# Patient Record
Sex: Female | Born: 1985
Health system: Southern US, Community
[De-identification: ages and names within clinical notes are randomized; demographics above are authoritative.]

## PROBLEM LIST (undated history)

## (undated) DIAGNOSIS — D649 Anemia, unspecified: Secondary | ICD-10-CM

---
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
## 2005-11-05 ENCOUNTER — Ambulatory Visit (HOSPITAL_COMMUNITY): Admission: RE | Admit: 2005-11-05 | Discharge: 2005-11-05 | Payer: Self-pay | Admitting: Obstetrics

## 2005-11-18 ENCOUNTER — Ambulatory Visit (HOSPITAL_COMMUNITY): Admission: RE | Admit: 2005-11-18 | Discharge: 2005-11-18 | Payer: Self-pay | Admitting: Obstetrics

## 2006-04-26 ENCOUNTER — Inpatient Hospital Stay (HOSPITAL_COMMUNITY): Admission: AD | Admit: 2006-04-26 | Discharge: 2006-04-26 | Payer: Self-pay | Admitting: Obstetrics

## 2006-05-08 ENCOUNTER — Inpatient Hospital Stay (HOSPITAL_COMMUNITY): Admission: RE | Admit: 2006-05-08 | Discharge: 2006-05-12 | Payer: Self-pay | Admitting: Obstetrics

## 2006-06-03 ENCOUNTER — Ambulatory Visit: Payer: Self-pay | Admitting: Oncology

## 2006-06-10 ENCOUNTER — Inpatient Hospital Stay (HOSPITAL_COMMUNITY): Admission: AD | Admit: 2006-06-10 | Discharge: 2006-06-12 | Payer: Self-pay | Admitting: Obstetrics

## 2006-06-16 LAB — CBC WITH DIFFERENTIAL/PLATELET
BASO%: 0.3 % (ref 0.0–2.0)
EOS%: 1.4 % (ref 0.0–7.0)
HCT: 35.7 % (ref 34.8–46.6)
RBC: 3.79 10*6/uL (ref 3.70–5.32)
WBC: 11.6 10*3/uL — ABNORMAL HIGH (ref 3.9–10.0)

## 2006-06-17 ENCOUNTER — Inpatient Hospital Stay (HOSPITAL_COMMUNITY): Admission: AD | Admit: 2006-06-17 | Discharge: 2006-06-21 | Payer: Self-pay | Admitting: Obstetrics

## 2006-06-17 LAB — FOLATE RBC: RBC Folate: 729 ng/mL — ABNORMAL HIGH (ref 180–600)

## 2006-06-17 LAB — IRON AND TIBC
%SAT: 7 % — ABNORMAL LOW (ref 20–55)
TIBC: 421 ug/dL (ref 250–470)
UIBC: 393 ug/dL

## 2006-06-17 LAB — FERRITIN: Ferritin: 64 ng/mL (ref 10–291)

## 2006-06-17 LAB — VITAMIN B12: Vitamin B-12: 619 pg/mL (ref 211–911)

## 2009-08-25 HISTORY — PX: TUBAL LIGATION: SHX77

## 2009-09-28 ENCOUNTER — Ambulatory Visit (HOSPITAL_COMMUNITY): Admission: RE | Admit: 2009-09-28 | Discharge: 2009-09-28 | Payer: Self-pay | Admitting: Obstetrics

## 2009-11-29 ENCOUNTER — Ambulatory Visit (HOSPITAL_COMMUNITY): Admission: RE | Admit: 2009-11-29 | Discharge: 2009-11-29 | Payer: Self-pay | Admitting: Obstetrics

## 2010-01-20 ENCOUNTER — Inpatient Hospital Stay (HOSPITAL_COMMUNITY): Admission: AD | Admit: 2010-01-20 | Discharge: 2010-01-20 | Payer: Self-pay | Admitting: Obstetrics

## 2010-04-21 ENCOUNTER — Inpatient Hospital Stay (HOSPITAL_COMMUNITY): Admission: AD | Admit: 2010-04-21 | Discharge: 2010-04-21 | Payer: Self-pay | Admitting: Obstetrics

## 2010-04-27 ENCOUNTER — Inpatient Hospital Stay (HOSPITAL_COMMUNITY): Admission: AD | Admit: 2010-04-27 | Discharge: 2010-04-29 | Payer: Self-pay | Admitting: Obstetrics

## 2010-07-03 ENCOUNTER — Ambulatory Visit (HOSPITAL_COMMUNITY): Admission: RE | Admit: 2010-07-03 | Discharge: 2010-07-03 | Payer: Self-pay | Admitting: Obstetrics

## 2010-11-05 LAB — CBC
HCT: 38.1 % (ref 36.0–46.0)
Hemoglobin: 12.6 g/dL (ref 12.0–15.0)
MCH: 30.5 pg (ref 26.0–34.0)
MCV: 92.5 fL (ref 78.0–100.0)
Platelets: 167 10*3/uL (ref 150–400)
RBC: 4.11 MIL/uL (ref 3.87–5.11)
RDW: 13.5 % (ref 11.5–15.5)
WBC: 7 10*3/uL (ref 4.0–10.5)

## 2010-11-05 LAB — SURGICAL PCR SCREEN
MRSA, PCR: NEGATIVE
Staphylococcus aureus: POSITIVE — AB

## 2010-11-07 LAB — CBC
HCT: 30.4 % — ABNORMAL LOW (ref 36.0–46.0)
MCHC: 33 g/dL (ref 30.0–36.0)
MCV: 91.4 fL (ref 78.0–100.0)
MCV: 92 fL (ref 78.0–100.0)
Platelets: 92 10*3/uL — ABNORMAL LOW (ref 150–400)
RBC: 3.31 MIL/uL — ABNORMAL LOW (ref 3.87–5.11)
RBC: 3.59 MIL/uL — ABNORMAL LOW (ref 3.87–5.11)
RDW: 15 % (ref 11.5–15.5)
WBC: 14.5 10*3/uL — ABNORMAL HIGH (ref 4.0–10.5)

## 2010-11-07 LAB — COMPREHENSIVE METABOLIC PANEL
AST: 36 U/L (ref 0–37)
Albumin: 2.9 g/dL — ABNORMAL LOW (ref 3.5–5.2)
Alkaline Phosphatase: 174 U/L — ABNORMAL HIGH (ref 39–117)
BUN: 6 mg/dL (ref 6–23)
Calcium: 8.9 mg/dL (ref 8.4–10.5)
GFR calc non Af Amer: >60 mL/min (ref >60)
Glucose, Bld: 75 mg/dL (ref 70–99)
Total Bilirubin: 0.5 mg/dL (ref 0.3–1.2)
Total Protein: 5.6 g/dL — ABNORMAL LOW (ref 6.0–8.3)

## 2010-11-07 LAB — LACTATE DEHYDROGENASE: LDH: 318 U/L — ABNORMAL HIGH (ref 94–250)

## 2010-11-07 LAB — RPR: RPR Ser Ql: NONREACTIVE

## 2010-11-11 LAB — URINALYSIS, ROUTINE W REFLEX MICROSCOPIC
Bilirubin Urine: NEGATIVE
Glucose, UA: NEGATIVE mg/dL
Hgb urine dipstick: NEGATIVE
Ketones, ur: NEGATIVE mg/dL
Protein, ur: NEGATIVE mg/dL
Specific Gravity, Urine: 1.02 (ref 1.005–1.030)
pH: 6.5 (ref 5.0–8.0)

## 2011-01-10 NOTE — Discharge Summary (Signed)
NAMEMIKALAH, SKYLES NO.:  [id number]   MEDICAL RECORD NO.:  [id number]          PATIENT TYPE:  INP   LOCATION:  9157                          FACILITY:  [facility]   PHYSICIAN:  Kathreen Cosier, M.D.DATE OF BIRTH:  1986/03/26   DATE OF ADMISSION:  05/08/2006  DATE OF DISCHARGE:  05/12/2006                                 DISCHARGE SUMMARY   Patient is a 25 year old gravida 2, para 1-0-0-1, [facility] June 20, 2006, was  admitted for premature contractions.  She had been on Terbutaline 2.5 p.o.  q.4 h. at home, and she was admitted for magnesium sulfate 4 g loaded, 2 g  oral.  She subsequently stopped contracting and was changed from magnesium  to Procardia 60 XL and was discharged on May 12, 2006, to see me in 2  weeks.   DISCHARGE DIAGNOSIS:  Status post admission for premature contraction.           ______________________________  Kathreen Cosier, M.D.     BAM/MEDQ  D:  06/03/2006  T:  06/04/2006  Job:  [id number]

## 2011-01-10 NOTE — Discharge Summary (Signed)
NAMEKARMAH, POTOCKI NO.:  [id number]   MEDICAL RECORD NO.:  [id number]          PATIENT TYPE:  INP   LOCATION:  9130                          FACILITY:  [facility]   PHYSICIAN:  Kathreen Cosier, M.D.DATE OF BIRTH:  June 12, 1986   DATE OF ADMISSION:  06/10/2006  DATE OF DISCHARGE:  06/12/2006                                 DISCHARGE SUMMARY   The patient is a 25 year old gravida 2, para 1-0-0-1, [facility] June 20, 2006,  in labor, 2-3 cm, 60%, vertex at -2 go 3.  Patient had a normal vaginal  delivery of a female, Apgars 8 and 9, nuchal cord cut prior to delivery of  the shoulders.  Post delivery hemoglobin was 12.4.  On admission, hemoglobin  12.8, white count 9.6, platelets 8.6.  Post delivery hemoglobin 11.8,  platelets 85.  Bleeding time 9.5.  Sodium 136, potassium 4.2.  Urine  negative.   The patient was discharged on the second postpartum day ambulatory and on a  regular diet.  To see me in six weeks.  She also had an appointment with the  hematologist, which she could not get prior to delivery, in one week.           ______________________________  Kathreen Cosier, M.D.     BAM/MEDQ  D:  07/01/2006  T:  07/01/2006  Job:  [id number]

## 2011-01-10 NOTE — Discharge Summary (Signed)
Meghan Bennett, GAUGHRAN NO.:  [id number]   MEDICAL RECORD NO.:  [id number]          PATIENT TYPE:  INP   LOCATION:  9307                          [facility]ACILITY:  [facility]   PHYSICIAN:  Kathreen Cosier, M.D.DATE OF BIRTH:  30-Sep-1985   DATE OF ADMISSION:  06/17/2006  DATE OF DISCHARGE:  06/21/2006                                 DISCHARGE SUMMARY   The patient is a 25 year old gravida 2, para 2-0-0-2, who had normal vaginal  delivery on October 17. She was readmitted on June 17, 2006 with lower  abdominal pain, more on the right.  She had a CT scan to rule out  appendicitis.  This was done.  Her uterus was tender.  She was admitted for  IV antibiotics for possible endometriosis.  She received ampicillin and  gentamicin and tolerated this well.  On admission, her white count was 17.5,  hemoglobin 12.6, platelets 307.  Creatinine 0.4.  On October 26, her white  count was normal was down to 7.7, hemoglobin 11.8.  Urinalysis was negative.  The patient did well and was discharged on June 21, 2006 on ampicillin  500 mg p.o. q.6h., to see me in four weeks.   DISCHARGE DIAGNOSIS:  Status post rehospitalization for endometriosis.           ______________________________  Kathreen Cosier, M.D.     BAM/MEDQ  D:  07/08/2006  T:  07/08/2006  Job:  [id number]

## 2012-05-11 ENCOUNTER — Emergency Department (HOSPITAL_BASED_OUTPATIENT_CLINIC_OR_DEPARTMENT_OTHER)
Admission: EM | Admit: 2012-05-11 | Discharge: 2012-05-11 | Disposition: A | Discharge status: Home / Self Care | Payer: BC Managed Care – PPO | Attending: Emergency Medicine | Admitting: Emergency Medicine

## 2012-05-11 ENCOUNTER — Encounter (HOSPITAL_BASED_OUTPATIENT_CLINIC_OR_DEPARTMENT_OTHER): Payer: Self-pay | Admitting: Emergency Medicine

## 2012-05-11 DIAGNOSIS — A499 Bacterial infection, unspecified: Secondary | ICD-10-CM | POA: Insufficient documentation

## 2012-05-11 DIAGNOSIS — N390 Urinary tract infection, site not specified: Secondary | ICD-10-CM

## 2012-05-11 DIAGNOSIS — B3749 Other urogenital candidiasis: Secondary | ICD-10-CM | POA: Insufficient documentation

## 2012-05-11 DIAGNOSIS — B373 Candidiasis of vulva and vagina: Secondary | ICD-10-CM

## 2012-05-11 DIAGNOSIS — N760 Acute vaginitis: Secondary | ICD-10-CM | POA: Insufficient documentation

## 2012-05-11 DIAGNOSIS — B9689 Other specified bacterial agents as the cause of diseases classified elsewhere: Secondary | ICD-10-CM | POA: Insufficient documentation

## 2012-05-11 LAB — URINALYSIS, ROUTINE W REFLEX MICROSCOPIC
Glucose, UA: NEGATIVE mg/dL
Hgb urine dipstick: NEGATIVE
Ketones, ur: NEGATIVE mg/dL
Nitrite: POSITIVE — AB
Protein, ur: NEGATIVE mg/dL
Specific Gravity, Urine: 1.025 (ref 1.005–1.030)
pH: 6 (ref 5.0–8.0)

## 2012-05-11 LAB — WET PREP, GENITAL: Trich, Wet Prep: NONE SEEN

## 2012-05-11 LAB — URINE MICROSCOPIC-ADD ON

## 2012-05-11 MED ORDER — SULFAMETHOXAZOLE-TRIMETHOPRIM 800-160 MG PO TABS: 1.0000 | ORAL_TABLET | Freq: Two times a day (BID) | ORAL | Status: DC

## 2012-05-11 MED ORDER — FLUCONAZOLE 150 MG PO TABS: ORAL_TABLET | ORAL | Status: DC

## 2012-05-11 MED ORDER — METRONIDAZOLE 500 MG PO TABS: 500.0000 mg | ORAL_TABLET | Freq: Two times a day (BID) | ORAL | Status: DC

## 2012-05-11 NOTE — ED Notes (Signed)
Pt reports 3/10 right sided flank pain that began on Saturday. Pt also reports fever last night. Pt took tylenol with results. NAD.

## 2012-05-11 NOTE — ED Notes (Signed)
MD at bedside. 

## 2012-05-11 NOTE — ED Provider Notes (Signed)
History     CSN: [id number]  Arrival date & time 05/11/12  1203   First MD Initiated Contact with Patient 05/11/12 1230      Chief Complaint  Patient presents with  . Flank Pain    (Consider location/radiation/quality/duration/timing/severity/associated sxs/prior treatment) HPI Comments: Patient with right flank pain for the past few days.  No injury or trauma.  Denies urinary complaints.  No fevers or chills.  She is also concerned she may have an std.  She reports a foul odor but no bleeding or discharge.  She is sexually active with two partners.  Her LMP was last week, however she has had a tubal ligation 2 years ago.  Patient is a 26 y.o. female presenting with flank pain. The history is provided by the patient.  Flank Pain This is a new problem. The current episode started 2 days ago. The problem occurs constantly. The problem has been gradually worsening. Associated symptoms include abdominal pain. Nothing aggravates the symptoms. Nothing relieves the symptoms. She has tried nothing for the symptoms. The treatment provided no relief.    History reviewed. No pertinent past medical history.  Past Surgical History  Procedure Date  . Tubal ligation 2011    No family history on file.  History  Substance Use Topics  . Smoking status: Former Smoker    Types: Cigarettes  . Smokeless tobacco: Not on file  . Alcohol Use: No    OB History    Grav Para Term Preterm Abortions TAB SAB Ect Mult Living                  Review of Systems  Gastrointestinal: Positive for abdominal pain.  Genitourinary: Positive for flank pain.  All other systems reviewed and are negative.    Allergies  Review of patient's allergies indicates no known allergies.  Home Medications  No current outpatient prescriptions on file.  BP 126/64  Pulse 87  Temp 98.2 F (36.8 C) (Oral)  Resp 16  Ht 5\' 2"  (1.575 m)  Wt 125 lb (56.7 kg)  BMI 22.86 kg/m2  SpO2 97%  LMP 05/03/2012  Physical  Exam  Nursing note and vitals reviewed. Constitutional: She is oriented to person, place, and time. She appears well-developed and well-nourished. No distress.  HENT:  Head: Normocephalic and atraumatic.  Neck: Normal range of motion. Neck supple.  Cardiovascular: Normal rate and regular rhythm.  Exam reveals no gallop and no friction rub.   No murmur heard. Pulmonary/Chest: Effort normal and breath sounds normal. No respiratory distress. She has no wheezes.  Abdominal: Soft. Bowel sounds are normal. She exhibits no distension. There is no tenderness.       There is ttp in the right flank and right mid abdomen.  There is no rebound or guarding.  Bowel sounds are normoactive.  Genitourinary: Uterus normal. Vaginal discharge found.       There is yellowish discharge present.  There is no bleeding.  There is no adnexal mass or cmt.  Musculoskeletal: Normal range of motion.  Neurological: She is alert and oriented to person, place, and time.  Skin: Skin is warm and dry. She is not diaphoretic.    ED Course  Procedures (including critical care time)   Labs Reviewed  URINALYSIS, ROUTINE W REFLEX MICROSCOPIC  WET PREP, GENITAL  GC/CHLAMYDIA PROBE AMP, GENITAL  PREGNANCY, URINE   No results found.   No diagnosis found.    MDM  The patient presents here with pain in the  right flank and suprapubic region.  She is also concerned she may have an std.  The ua shows a ua and the wet prep looks like both a yeast infection and bacterial vaginosis.  The GC and chlamydia tests are pending at this point.  Will treat with bactrim for the urine, diflucan for the yeast, and flagyl for the BV.  Will call if her other tests return and require further treatment.          Geoffery Lyons, MD 05/11/12 [phone]

## 2012-05-12 LAB — GC/CHLAMYDIA PROBE AMP, GENITAL: Chlamydia, DNA Probe: NEGATIVE

## 2012-05-12 NOTE — ED Notes (Signed)
Pt called requesting return to work note. Note printed and will be at registration desk.

## 2012-05-28 ENCOUNTER — Encounter (HOSPITAL_BASED_OUTPATIENT_CLINIC_OR_DEPARTMENT_OTHER): Payer: Self-pay

## 2012-05-28 ENCOUNTER — Emergency Department (HOSPITAL_BASED_OUTPATIENT_CLINIC_OR_DEPARTMENT_OTHER)
Admission: EM | Admit: 2012-05-28 | Discharge: 2012-05-28 | Disposition: A | Discharge status: Home / Self Care | Payer: Self-pay | Attending: Emergency Medicine | Admitting: Emergency Medicine

## 2012-05-28 DIAGNOSIS — B373 Candidiasis of vulva and vagina: Secondary | ICD-10-CM

## 2012-05-28 DIAGNOSIS — N898 Other specified noninflammatory disorders of vagina: Secondary | ICD-10-CM | POA: Insufficient documentation

## 2012-05-28 DIAGNOSIS — F17200 Nicotine dependence, unspecified, uncomplicated: Secondary | ICD-10-CM | POA: Insufficient documentation

## 2012-05-28 LAB — URINE MICROSCOPIC-ADD ON

## 2012-05-28 LAB — URINALYSIS, ROUTINE W REFLEX MICROSCOPIC
Bilirubin Urine: NEGATIVE
Glucose, UA: NEGATIVE mg/dL
Hgb urine dipstick: NEGATIVE
Protein, ur: NEGATIVE mg/dL
Specific Gravity, Urine: 1.018 (ref 1.005–1.030)
Urobilinogen, UA: 0.2 mg/dL (ref 0.0–1.0)

## 2012-05-28 LAB — WET PREP, GENITAL

## 2012-05-28 MED ORDER — FLUCONAZOLE 150 MG PO TABS: 150.0000 mg | ORAL_TABLET | Freq: Once | ORAL | Status: DC

## 2012-05-28 NOTE — ED Provider Notes (Signed)
History     CSN: [id number]  Arrival date & time 05/28/12  1132   First MD Initiated Contact with Patient 05/28/12 1209      Chief Complaint  Patient presents with  . Vaginal Itching  . Vaginal Discharge    (Consider location/radiation/quality/duration/timing/severity/associated sxs/prior treatment) Patient is a 26 y.o. female presenting with vaginal itching and vaginal discharge. The history is provided by the patient. No language interpreter was used.  Vaginal Itching This is a new problem. The current episode started today. The problem occurs constantly. The problem has been gradually worsening. Associated symptoms comments: Vaginal discharge. Nothing aggravates the symptoms. She has tried nothing for the symptoms. The treatment provided no relief.  Vaginal Discharge    History reviewed. No pertinent past medical history.  Past Surgical History  Procedure Date  . Tubal ligation 2011    No family history on file.  History  Substance Use Topics  . Smoking status: Light Tobacco Smoker    Types: Cigarettes  . Smokeless tobacco: Not on file  . Alcohol Use: Yes     occasional    OB History    Grav Para Term Preterm Abortions TAB SAB Ect Mult Living                  Review of Systems  Genitourinary: Positive for vaginal discharge.  All other systems reviewed and are negative.    Allergies  Review of patient's allergies indicates no known allergies.  Home Medications   Current Outpatient Rx  Name Route Sig Dispense Refill  . FLUCONAZOLE 150 MG PO TABS  Take once, then repeat in two weeks. 2 tablet 0  . METRONIDAZOLE 500 MG PO TABS Oral Take 1 tablet (500 mg total) by mouth 2 (two) times daily. One po bid x 7 days 14 tablet 0  . SULFAMETHOXAZOLE-TRIMETHOPRIM 800-160 MG PO TABS Oral Take 1 tablet by mouth 2 (two) times daily. 10 tablet 0    BP 113/65  Pulse 77  Temp 98.7 F (37.1 C) (Oral)  Resp 16  Ht 5\' 2"  (1.575 m)  Wt 120 lb (54.432 kg)  BMI 21.95  kg/m2  SpO2 100%  LMP 04/16/2012  Physical Exam  Nursing note and vitals reviewed. Constitutional: She appears well-developed and well-nourished.  HENT:  Head: Normocephalic and atraumatic.  Eyes: Pupils are equal, round, and reactive to light.  Neck: Normal range of motion.  Cardiovascular: Normal rate.   Pulmonary/Chest: Effort normal and breath sounds normal.  Abdominal: Soft. Bowel sounds are normal.  Genitourinary: Vaginal discharge found.       Thick white vaginal discharge  Musculoskeletal: Normal range of motion.  Neurological: She is alert.  Skin: Skin is warm.    ED Course  Procedures (including critical care time)  Labs Reviewed  URINALYSIS, ROUTINE W REFLEX MICROSCOPIC - Abnormal; Notable for the following:    Leukocytes, UA MODERATE (*)     All other components within normal limits  URINE MICROSCOPIC-ADD ON - Abnormal; Notable for the following:    Squamous Epithelial / LPF MANY (*)     Bacteria, UA MANY (*)     All other components within normal limits  WET PREP, GENITAL - Abnormal; Notable for the following:    Yeast Wet Prep HPF POC TOO NUMEROUS TO COUNT (*)     WBC, Wet Prep HPF POC RARE (*)     All other components within normal limits  PREGNANCY, URINE  GC/CHLAMYDIA PROBE AMP, GENITAL   No results  found.   No diagnosis found.    MDM  Pt given rx for diflucan.  Urine contaminated,  No symptoms.  I will not treat urine.          Lonia Skinner [place], [place] 05/28/12 1251

## 2012-05-28 NOTE — ED Notes (Signed)
Pt reports vaginal itching and discharge x 1 week.

## 2012-05-31 NOTE — ED Provider Notes (Signed)
Medical screening examination/treatment/procedure(s) were performed by non-physician practitioner and as supervising physician I was immediately available for consultation/collaboration.   Gwyneth Sprout, MD 05/31/12 [phone]

## 2013-03-25 ENCOUNTER — Encounter (HOSPITAL_BASED_OUTPATIENT_CLINIC_OR_DEPARTMENT_OTHER): Payer: Self-pay | Admitting: *Deleted

## 2013-03-25 ENCOUNTER — Emergency Department (HOSPITAL_BASED_OUTPATIENT_CLINIC_OR_DEPARTMENT_OTHER)
Admission: EM | Admit: 2013-03-25 | Discharge: 2013-03-25 | Disposition: A | Discharge status: Home / Self Care | Payer: Self-pay | Attending: Emergency Medicine | Admitting: Emergency Medicine

## 2013-03-25 DIAGNOSIS — L293 Anogenital pruritus, unspecified: Secondary | ICD-10-CM | POA: Insufficient documentation

## 2013-03-25 DIAGNOSIS — A599 Trichomoniasis, unspecified: Secondary | ICD-10-CM

## 2013-03-25 DIAGNOSIS — Z9851 Tubal ligation status: Secondary | ICD-10-CM | POA: Insufficient documentation

## 2013-03-25 DIAGNOSIS — Z3202 Encounter for pregnancy test, result negative: Secondary | ICD-10-CM | POA: Insufficient documentation

## 2013-03-25 DIAGNOSIS — F17200 Nicotine dependence, unspecified, uncomplicated: Secondary | ICD-10-CM | POA: Insufficient documentation

## 2013-03-25 DIAGNOSIS — A5901 Trichomonal vulvovaginitis: Secondary | ICD-10-CM | POA: Insufficient documentation

## 2013-03-25 DIAGNOSIS — N949 Unspecified condition associated with female genital organs and menstrual cycle: Secondary | ICD-10-CM | POA: Insufficient documentation

## 2013-03-25 DIAGNOSIS — R350 Frequency of micturition: Secondary | ICD-10-CM | POA: Insufficient documentation

## 2013-03-25 DIAGNOSIS — R109 Unspecified abdominal pain: Secondary | ICD-10-CM | POA: Insufficient documentation

## 2013-03-25 LAB — URINALYSIS, ROUTINE W REFLEX MICROSCOPIC
Glucose, UA: NEGATIVE mg/dL
Hgb urine dipstick: NEGATIVE
Protein, ur: NEGATIVE mg/dL
pH: 7 (ref 5.0–8.0)

## 2013-03-25 LAB — PREGNANCY, URINE: Preg Test, Ur: NEGATIVE

## 2013-03-25 LAB — URINE MICROSCOPIC-ADD ON

## 2013-03-25 MED ORDER — DOXYCYCLINE HYCLATE 100 MG PO CAPS: 100.0000 mg | ORAL_CAPSULE | Freq: Two times a day (BID) | ORAL | Status: DC

## 2013-03-25 MED ORDER — LIDOCAINE HCL (PF) 1 % IJ SOLN
INTRAMUSCULAR | Status: AC
  Administered 2013-03-25: 0.9 mL
  Filled 2013-03-25: qty 5

## 2013-03-25 MED ORDER — CEFTRIAXONE SODIUM 250 MG IJ SOLR
250.0000 mg | Freq: Once | INTRAMUSCULAR | Status: AC
  Administered 2013-03-25: 250 mg via INTRAMUSCULAR
  Filled 2013-03-25: qty 250

## 2013-03-25 MED ORDER — METRONIDAZOLE 500 MG PO TABS: 500.0000 mg | ORAL_TABLET | Freq: Once | ORAL | Status: DC

## 2013-03-25 MED ORDER — METRONIDAZOLE 500 MG PO TABS: 500.0000 mg | ORAL_TABLET | Freq: Two times a day (BID) | ORAL | Status: DC

## 2013-03-25 NOTE — ED Notes (Signed)
MD at bedside. 

## 2013-03-25 NOTE — ED Provider Notes (Signed)
CSN: [id number]     Arrival date & time 03/25/13  4098 History     First MD Initiated Contact with Patient 03/25/13 [phone]     Chief Complaint  Patient presents with  . Vaginal Discharge  . Vaginal Itching   (Consider location/radiation/quality/duration/timing/severity/associated sxs/prior Treatment) HPI Comments: Patient presents with vaginal discharge and lower abdominal cramping. She states the vaginal discharge is been gone on for about a week. She has some intermittent cramping in her lower abdomen. She denies any nausea vomiting or fevers. She's had a little bit of urinary frequency. She's had a history of bacterial vaginosis in the past but denies any past sexual transmitted diseases. She denies any vaginal bleeding. Her last menstrual period was about 2-3 weeks ago.  Patient is a 27 y.o. female presenting with vaginal discharge and vaginal itching.  Vaginal Discharge Associated symptoms: vaginal itching   Associated symptoms: no abdominal pain, no fever, no nausea and no vomiting   Vaginal Itching Pertinent negatives include no chest pain, no abdominal pain, no headaches and no shortness of breath.    History reviewed. No pertinent past medical history. Past Surgical History  Procedure Laterality Date  . Tubal ligation  2011   History reviewed. No pertinent family history. History  Substance Use Topics  . Smoking status: Light Tobacco Smoker    Types: Cigarettes  . Smokeless tobacco: Not on file  . Alcohol Use: Yes     Comment: occasional   OB History   Grav Para Term Preterm Abortions TAB SAB Ect Mult Living                 Review of Systems  Constitutional: Negative for fever, chills, diaphoresis and fatigue.  HENT: Negative for congestion, rhinorrhea and sneezing.   Eyes: Negative.   Respiratory: Negative for cough, chest tightness and shortness of breath.   Cardiovascular: Negative for chest pain and leg swelling.  Gastrointestinal: Negative for nausea,  vomiting, abdominal pain, diarrhea and blood in stool.  Genitourinary: Positive for vaginal discharge and pelvic pain. Negative for frequency, hematuria, flank pain and difficulty urinating.  Musculoskeletal: Negative for back pain and arthralgias.  Skin: Negative for rash.  Neurological: Negative for dizziness, speech difficulty, weakness, numbness and headaches.    Allergies  Review of patient's allergies indicates no known allergies.  Home Medications   Current Outpatient Rx  Name  Route  Sig  Dispense  Refill  . doxycycline (VIBRAMYCIN) 100 MG capsule   Oral   Take 1 capsule (100 mg total) by mouth 2 (two) times daily. One po bid x 7 days   20 capsule   0   . fluconazole (DIFLUCAN) 150 MG tablet      Take once, then repeat in two weeks.   2 tablet   0   . fluconazole (DIFLUCAN) 150 MG tablet   Oral   Take 1 tablet (150 mg total) by mouth once.   1 tablet   1   . metroNIDAZOLE (FLAGYL) 500 MG tablet   Oral   Take 1 tablet (500 mg total) by mouth 2 (two) times daily. One po bid x 7 days   14 tablet   0   . metroNIDAZOLE (FLAGYL) 500 MG tablet   Oral   Take 1 tablet (500 mg total) by mouth 2 (two) times daily. One po bid x 7 days   14 tablet   0   . sulfamethoxazole-trimethoprim (BACTRIM DS,SEPTRA DS) 800-160 MG per tablet   Oral  Take 1 tablet by mouth 2 (two) times daily.   10 tablet   0    BP 129/79  Pulse 98  Temp(Src) 97.9 F (36.6 C) (Oral)  Resp 18  SpO2 100%  LMP 03/18/2013 Physical Exam  Constitutional: She is oriented to person, place, and time. She appears well-developed and well-nourished.  HENT:  Head: Normocephalic and atraumatic.  Eyes: Pupils are equal, round, and reactive to light.  Neck: Normal range of motion. Neck supple.  Cardiovascular: Normal rate, regular rhythm and normal heart sounds.   Pulmonary/Chest: Effort normal and breath sounds normal. No respiratory distress. She has no wheezes. She has no rales. She exhibits no  tenderness.  Abdominal: Soft. Bowel sounds are normal. There is no tenderness. There is no rebound and no guarding.  Genitourinary:  Patient with thin white vaginal discharge. There's some mild tenderness on exam but no significant cervical motion tenderness or adnexal tenderness. There is no lesions or sores noted.  Musculoskeletal: Normal range of motion. She exhibits no edema.  Lymphadenopathy:    She has no cervical adenopathy.  Neurological: She is alert and oriented to person, place, and time.  Skin: Skin is warm and dry. No rash noted.  Psychiatric: She has a normal mood and affect.    ED Course   Procedures (including critical care time)  Results for orders placed during the hospital encounter of 03/25/13  URINALYSIS, ROUTINE W REFLEX MICROSCOPIC      Result Value Range   Color, Urine YELLOW  YELLOW   APPearance CLOUDY (*) CLEAR   Specific Gravity, Urine 1.023  1.005 - 1.030   pH 7.0  5.0 - 8.0   Glucose, UA NEGATIVE  NEGATIVE mg/dL   Hgb urine dipstick NEGATIVE  NEGATIVE   Bilirubin Urine NEGATIVE  NEGATIVE   Ketones, ur NEGATIVE  NEGATIVE mg/dL   Protein, ur NEGATIVE  NEGATIVE mg/dL   Urobilinogen, UA 1.0  0.0 - 1.0 mg/dL   Nitrite NEGATIVE  NEGATIVE   Leukocytes, UA LARGE (*) NEGATIVE  PREGNANCY, URINE      Result Value Range   Preg Test, Ur NEGATIVE  NEGATIVE  URINE MICROSCOPIC-ADD ON      Result Value Range   Squamous Epithelial / LPF FEW (*) RARE   WBC, UA 21-50  <3 WBC/hpf   RBC / HPF 0-2  <3 RBC/hpf   Bacteria, UA MANY (*) RARE   Urine-Other TRICHOMONAS PRESENT     No results found.   No results found. 1. Trichomonas     MDM  Patient was given a prescription for doxycycline and Flagyl. She was given dose Rocephin in the ED. She was advised to followup with her primary care physician or OB/GYN if her symptoms are not improving within the next week. She was given a referral to [facility] outpatient center.  Rolan Bucco, MD 03/25/13 1043

## 2013-03-25 NOTE — ED Notes (Signed)
Pelvic cart is at the bedside set up and ready for the doctor to use. 

## 2013-03-25 NOTE — ED Notes (Signed)
Pt amb to triage with quick steady gait in nad. Pt reports white vag discharge and itching x 1 week, also frequency and urgency.

## 2013-03-26 LAB — URINE CULTURE: Colony Count: >=100000

## 2013-03-30 NOTE — ED Notes (Signed)
+   Chlamydia Patient treated with Rocephin and Doxycyline.

## 2014-02-03 ENCOUNTER — Emergency Department (INDEPENDENT_AMBULATORY_CARE_PROVIDER_SITE_OTHER)
Admission: EM | Admit: 2014-02-03 | Discharge: 2014-02-03 | Disposition: A | Discharge status: Home / Self Care | Payer: Medicaid Other | Source: Home / Self Care | Attending: Family Medicine | Admitting: Family Medicine

## 2014-02-03 ENCOUNTER — Encounter (HOSPITAL_COMMUNITY): Payer: Self-pay | Admitting: Emergency Medicine

## 2014-02-03 DIAGNOSIS — J029 Acute pharyngitis, unspecified: Secondary | ICD-10-CM

## 2014-02-03 LAB — POCT RAPID STREP A: Streptococcus, Group A Screen (Direct): NEGATIVE

## 2014-02-03 MED ORDER — IBUPROFEN 800 MG PO TABS: 800.0000 mg | ORAL_TABLET | Freq: Three times a day (TID) | ORAL | Status: DC | PRN

## 2014-02-03 MED ORDER — AMOXICILLIN 875 MG PO TABS: 875.0000 mg | ORAL_TABLET | Freq: Two times a day (BID) | ORAL | Status: DC

## 2014-02-03 MED ORDER — METHYLPREDNISOLONE 4 MG PO KIT: PACK | ORAL | Status: DC

## 2014-02-03 NOTE — ED Provider Notes (Signed)
CSN: [id number]     Arrival date & time 02/03/14  1346 History   None    Chief Complaint  Patient presents with  . Sore Throat   (Consider location/radiation/quality/duration/timing/severity/associated sxs/prior Treatment) HPI Comments: 28 year old female presents for evaluation of fever, chills, sore throat. This started 2 days ago with just the sore throat. Yesterday she started to have chills and noticed that she has swollen tonsils. she has tried taking TheraFlu one time for this but has not tried taking anything else. She has also had some mild nausea. No cough, shortness of breath, abdominal pain, vomiting, recent travel, sick contacts.  Patient is a 28 y.o. female presenting with pharyngitis.  Sore Throat Pertinent negatives include no abdominal pain.    History reviewed. No pertinent past medical history. Past Surgical History  Procedure Laterality Date  . Tubal ligation  2011   No family history on file. History  Substance Use Topics  . Smoking status: Light Tobacco Smoker    Types: Cigarettes  . Smokeless tobacco: Not on file  . Alcohol Use: Yes     Comment: occasional   OB History   Grav Para Term Preterm Abortions TAB SAB Ect Mult Living                 Review of Systems  Constitutional: Positive for fever, chills and fatigue.  HENT: Positive for sore throat (with enlarged tonsils). Negative for congestion, ear pain and rhinorrhea.   Gastrointestinal: Positive for nausea. Negative for vomiting, abdominal pain and diarrhea.  All other systems reviewed and are negative.   Allergies  Review of patient's allergies indicates no known allergies.  Home Medications   Prior to Admission medications   Medication Sig Start Date End Date Taking? Authorizing Provider  Chlorphen-Pseudoephed-APAP [facility](THERAFLU FLU/COLD PO) Take by mouth.   Yes Historical Provider, MD  amoxicillin (AMOXIL) 875 MG tablet Take 1 tablet (875 mg total) by mouth 2 (two) times daily. 02/03/14    Graylon GoodZachary H Beverlyn Mcginness, PA-C  doxycycline (VIBRAMYCIN) 100 MG capsule Take 1 capsule (100 mg total) by mouth 2 (two) times daily. One po bid x 7 days 03/25/13   Rolan BuccoMelanie Belfi, MD  fluconazole (DIFLUCAN) 150 MG tablet Take once, then repeat in two weeks. 05/11/12   Geoffery Lyonsouglas Delo, MD  fluconazole (DIFLUCAN) 150 MG tablet Take 1 tablet (150 mg total) by mouth once. 05/28/12   Elson AreasLeslie K Sofia, PA-C  ibuprofen (ADVIL,MOTRIN) 800 MG tablet Take 1 tablet (800 mg total) by mouth every 8 (eight) hours as needed for fever or moderate pain. 02/03/14   Graylon GoodZachary H Evone Arseneau, PA-C  methylPREDNISolone (MEDROL DOSEPAK) 4 MG tablet Use as directed on package instructions 02/03/14   Graylon GoodZachary H Copelan Maultsby, PA-C  metroNIDAZOLE (FLAGYL) 500 MG tablet Take 1 tablet (500 mg total) by mouth 2 (two) times daily. One po bid x 7 days 05/11/12   Geoffery Lyonsouglas Delo, MD  metroNIDAZOLE (FLAGYL) 500 MG tablet Take 1 tablet (500 mg total) by mouth 2 (two) times daily. One po bid x 7 days 03/25/13   Rolan BuccoMelanie Belfi, MD  sulfamethoxazole-trimethoprim (BACTRIM DS,SEPTRA DS) 800-160 MG per tablet Take 1 tablet by mouth 2 (two) times daily. 05/11/12   Geoffery Lyonsouglas Delo, MD   BP 123/78  Pulse 101  Temp(Src) 100.3 F (37.9 C) (Oral)  Resp 16  SpO2 98% Physical Exam  Nursing note and vitals reviewed. Constitutional: She is oriented to person, place, and time. Vital signs are normal. She appears well-developed and well-nourished. No distress.  HENT:  Head: Normocephalic and atraumatic.  Nose: Nose normal. Right sinus exhibits no maxillary sinus tenderness and no frontal sinus tenderness. Left sinus exhibits no maxillary sinus tenderness and no frontal sinus tenderness.  Mouth/Throat: Uvula is midline and mucous membranes are normal. Posterior oropharyngeal erythema (Mild) present. No oropharyngeal exudate.  Cardiovascular: Normal rate, regular rhythm and normal heart sounds.   Pulmonary/Chest: Effort normal and breath sounds normal. No respiratory distress.  Lymphadenopathy:        Head (right side): Tonsillar adenopathy present.       Head (left side): Tonsillar adenopathy present.  Neurological: She is alert and oriented to person, place, and time. She has normal strength. Coordination normal.  Skin: Skin is warm and dry. No rash noted. She is not diaphoretic.  Psychiatric: She has a normal mood and affect. Judgment normal.    ED Course  Procedures (including critical care time) Labs Review Labs Reviewed  POCT RAPID STREP A ([facility] CARE ONLY)    Imaging Review No results found.   MDM   1. Pharyngitis    Strep is negative and throat does not appear to be very inflamed. Most likely a viral infection. Treat symptomatically. Postdated prescription for amoxicillin.  Meds ordered this encounter  Medications  . Chlorphen-Pseudoephed-APAP (THERAFLU FLU/COLD PO)    Sig: Take by mouth.  . methylPREDNISolone (MEDROL DOSEPAK) 4 MG tablet    Sig: Use as directed on package instructions    Dispense:  21 tablet    Refill:  0    Order Specific Question:  Supervising Provider    Answer:  Lorenz CoasterKELLER, DAVID C [id number]]  . ibuprofen (ADVIL,MOTRIN) 800 MG tablet    Sig: Take 1 tablet (800 mg total) by mouth every 8 (eight) hours as needed for fever or moderate pain.    Dispense:  30 tablet    Refill:  0    Order Specific Question:  Supervising Provider    Answer:  Lorenz CoasterKELLER, DAVID C [id number]]  . amoxicillin (AMOXIL) 875 MG tablet    Sig: Take 1 tablet (875 mg total) by mouth 2 (two) times daily.    Dispense:  14 tablet    Refill:  0    Order Specific Question:  Supervising Provider    Answer:  Lorenz CoasterKELLER, DAVID C [[id number]]       Graylon GoodZachary H Masyn Fullam, PA-C 02/03/14 1537

## 2014-02-03 NOTE — ED Notes (Signed)
Sore, swollen tonsils and running a fever, onset 6/10.

## 2014-02-03 NOTE — Discharge Instructions (Signed)
Pharyngitis Pharyngitis is redness, pain, and swelling (inflammation) of your pharynx.  CAUSES  Pharyngitis is usually caused by infection. Most of the time, these infections are from viruses (viral) and are part of a cold. However, sometimes pharyngitis is caused by bacteria (bacterial). Pharyngitis can also be caused by allergies. Viral pharyngitis may be spread from person to person by coughing, sneezing, and personal items or utensils (cups, forks, spoons, toothbrushes). Bacterial pharyngitis may be spread from person to person by more intimate contact, such as kissing.  SIGNS AND SYMPTOMS  Symptoms of pharyngitis include:   Sore throat.   Tiredness (fatigue).   Low-grade fever.   Headache.  Joint pain and muscle aches.  Skin rashes.  Swollen lymph nodes.  Plaque-like film on throat or tonsils (often seen with bacterial pharyngitis). DIAGNOSIS  Your health care provider will ask you questions about your illness and your symptoms. Your medical history, along with a physical exam, is often all that is needed to diagnose pharyngitis. Sometimes, a rapid strep test is done. Other lab tests may also be done, depending on the suspected cause.  TREATMENT  Viral pharyngitis will usually get better in 3 4 days without the use of medicine. Bacterial pharyngitis is treated with medicines that kill germs (antibiotics).  HOME CARE INSTRUCTIONS   Drink enough water and fluids to keep your urine clear or pale yellow.   Only take over-the-counter or prescription medicines as directed by your health care provider:   If you are prescribed antibiotics, make sure you finish them even if you start to feel better.   Do not take aspirin.   Get lots of rest.   Gargle with 8 oz of salt water ( tsp of salt per 1 qt of water) as often as every 1 2 hours to soothe your throat.   Throat lozenges (if you are not at risk for choking) or sprays may be used to soothe your throat. SEEK MEDICAL  CARE IF:   You have large, tender lumps in your neck.  You have a rash.  You cough up green, yellow-brown, or bloody spit. SEEK IMMEDIATE MEDICAL CARE IF:   Your neck becomes stiff.  You drool or are unable to swallow liquids.  You vomit or are unable to keep medicines or liquids down.  You have severe pain that does not go away with the use of recommended medicines.  You have trouble breathing (not caused by a stuffy nose). MAKE SURE YOU:   Understand these instructions.  Will watch your condition.  Will get help right away if you are not doing well or get worse. Document Released: 08/11/2005 Document Revised: 06/01/2013 Document Reviewed: 04/18/2013 [facility] Patient Information 2014 [place], [place].  Antibiotic Nonuse  Your caregiver felt that the infection or problem was not one that would be helped with an antibiotic. Infections may be caused by viruses or bacteria. Only a caregiver can tell which one of these is the likely cause of an illness. A cold is the most common cause of infection in both adults and children. A cold is a virus. Antibiotic treatment will have no effect on a viral infection. Viruses can lead to many lost days of work caring for sick children and many missed days of school. Children may catch as many as 10 "colds" or "flus" per year during which they can be tearful, cranky, and uncomfortable. The goal of treating a virus is aimed at keeping the ill person comfortable. Antibiotics are medications used to help the body  fight bacterial infections. There are relatively few types of bacteria that cause infections but there are hundreds of viruses. While both viruses and bacteria cause infection they are very different types of germs. A viral infection will typically go away by itself within 7 to 10 days. Bacterial infections may spread or get worse without antibiotic treatment. Examples of bacterial infections are:  Sore throats (like strep throat or  tonsillitis).  Infection in the lung (pneumonia).  Ear and skin infections. Examples of viral infections are:  Colds or flus.  Most coughs and bronchitis.  Sore throats not caused by Strep.  Runny noses. It is often best not to take an antibiotic when a viral infection is the cause of the problem. Antibiotics can kill off the helpful bacteria that we have inside our body and allow harmful bacteria to start growing. Antibiotics can cause side effects such as allergies, nausea, and diarrhea without helping to improve the symptoms of the viral infection. Additionally, repeated uses of antibiotics can cause bacteria inside of our body to become resistant. That resistance can be passed onto harmful bacterial. The next time you have an infection it may be harder to treat if antibiotics are used when they are not needed. Not treating with antibiotics allows our own immune system to develop and take care of infections more efficiently. Also, antibiotics will work better for us when they are prescribed for bacterial infections. Treatments for a child that is ill may include:  Give extra fluids throughout the day to stay hydrated.  Get plenty of rest.  Only give your child over-the-counter or prescription medicines for pain, discomfort, or fever as directed by your caregiver.  The use of a cool mist humidifier may help stuffy noses.  Cold medications if suggested by your caregiver. Your caregiver may decide to start you on an antibiotic if:  The problem you were seen for today continues for a longer length of time than expected.  You develop a secondary bacterial infection. SEEK MEDICAL CARE IF:  Fever lasts longer than 5 days.  Symptoms continue to get worse after 5 to 7 days or become severe.  Difficulty in breathing develops.  Signs of dehydration develop (poor drinking, rare urinating, dark colored urine).  Changes in behavior or worsening tiredness (listlessness or  lethargy). Document Released: 10/20/2001 Document Revised: 11/03/2011 Document Reviewed: 04/18/2009 [facility]Exit[facility] Patient Information 2014 [place]ExitCare, [place]LL[place].

## 2014-02-05 LAB — CULTURE, GROUP A STREP

## 2014-02-07 ENCOUNTER — Emergency Department (HOSPITAL_COMMUNITY): Payer: Medicaid Other

## 2014-02-07 ENCOUNTER — Encounter (HOSPITAL_COMMUNITY): Payer: Self-pay | Admitting: Emergency Medicine

## 2014-02-07 ENCOUNTER — Inpatient Hospital Stay (HOSPITAL_COMMUNITY)
Admission: EM | Admit: 2014-02-07 | Discharge: 2014-02-10 | DRG: 153 | Disposition: A | Discharge status: Home / Self Care | Payer: Medicaid Other | Attending: Internal Medicine | Admitting: Internal Medicine

## 2014-02-07 ENCOUNTER — Emergency Department (INDEPENDENT_AMBULATORY_CARE_PROVIDER_SITE_OTHER)
Admission: EM | Admit: 2014-02-07 | Discharge: 2014-02-07 | Disposition: A | Discharge status: Nursing Home (Includes ICF) | Payer: Medicaid Other | Source: Home / Self Care | Attending: Family Medicine | Admitting: Family Medicine

## 2014-02-07 DIAGNOSIS — K219 Gastro-esophageal reflux disease without esophagitis: Secondary | ICD-10-CM | POA: Diagnosis present

## 2014-02-07 DIAGNOSIS — D649 Anemia, unspecified: Secondary | ICD-10-CM

## 2014-02-07 DIAGNOSIS — K2990 Gastroduodenitis, unspecified, without bleeding: Secondary | ICD-10-CM

## 2014-02-07 DIAGNOSIS — D509 Iron deficiency anemia, unspecified: Secondary | ICD-10-CM | POA: Diagnosis present

## 2014-02-07 DIAGNOSIS — R112 Nausea with vomiting, unspecified: Secondary | ICD-10-CM | POA: Diagnosis present

## 2014-02-07 DIAGNOSIS — J029 Acute pharyngitis, unspecified: Principal | ICD-10-CM | POA: Diagnosis present

## 2014-02-07 DIAGNOSIS — D500 Iron deficiency anemia secondary to blood loss (chronic): Secondary | ICD-10-CM

## 2014-02-07 DIAGNOSIS — N920 Excessive and frequent menstruation with regular cycle: Secondary | ICD-10-CM | POA: Diagnosis present

## 2014-02-07 DIAGNOSIS — F17200 Nicotine dependence, unspecified, uncomplicated: Secondary | ICD-10-CM | POA: Diagnosis present

## 2014-02-07 DIAGNOSIS — R000 Tachycardia, unspecified: Secondary | ICD-10-CM | POA: Diagnosis present

## 2014-02-07 DIAGNOSIS — K2970 Gastritis, unspecified, without bleeding: Secondary | ICD-10-CM | POA: Diagnosis present

## 2014-02-07 DIAGNOSIS — N922 Excessive menstruation at puberty: Secondary | ICD-10-CM

## 2014-02-07 DIAGNOSIS — IMO0002 Reserved for concepts with insufficient information to code with codable children: Secondary | ICD-10-CM

## 2014-02-07 DIAGNOSIS — E041 Nontoxic single thyroid nodule: Secondary | ICD-10-CM | POA: Diagnosis present

## 2014-02-07 HISTORY — DX: Anemia, unspecified: D64.9

## 2014-02-07 LAB — CBC
HEMATOCRIT: 24.5 % — AB (ref 36.0–46.0)
Hemoglobin: 7.2 g/dL — ABNORMAL LOW (ref 12.0–15.0)
MCH: 20.6 pg — ABNORMAL LOW (ref 26.0–34.0)
MCHC: 29.4 g/dL — AB (ref 30.0–36.0)
MCV: 70.2 fL — AB (ref 78.0–100.0)
Platelets: 194 10*3/uL (ref 150–400)
RBC: 3.49 MIL/uL — ABNORMAL LOW (ref 3.87–5.11)
RDW: 19.6 % — ABNORMAL HIGH (ref 11.5–15.5)
WBC: 11.6 10*3/uL — ABNORMAL HIGH (ref 4.0–10.5)

## 2014-02-07 LAB — BASIC METABOLIC PANEL
BUN: 9 mg/dL (ref 6–23)
CALCIUM: 8.6 mg/dL (ref 8.4–10.5)
CO2: 22 mEq/L (ref 19–32)
CREATININE: 0.73 mg/dL (ref 0.50–1.10)
Chloride: 95 mEq/L — ABNORMAL LOW (ref 96–112)
GFR calc Af Amer: >90 mL/min (ref >90)
GLUCOSE: 93 mg/dL (ref 70–99)
Potassium: 4.1 mEq/L (ref 3.7–5.3)
SODIUM: 135 meq/L — AB (ref 137–147)

## 2014-02-07 LAB — POCT INFECTIOUS MONO SCREEN: MONO SCREEN: NEGATIVE

## 2014-02-07 LAB — CBC WITH DIFFERENTIAL/PLATELET
BASOS ABS: 0 10*3/uL (ref 0.0–0.1)
Basophils Relative: 0 % (ref 0–1)
Eosinophils Absolute: 0 10*3/uL (ref 0.0–0.7)
Eosinophils Relative: 0 % (ref 0–5)
HCT: 27.6 % — ABNORMAL LOW (ref 36.0–46.0)
Hemoglobin: 8.2 g/dL — ABNORMAL LOW (ref 12.0–15.0)
LYMPHS ABS: 1.3 10*3/uL (ref 0.7–4.0)
Lymphocytes Relative: 12 % (ref 12–46)
MCH: 20.8 pg — AB (ref 26.0–34.0)
MCHC: 29.7 g/dL — ABNORMAL LOW (ref 30.0–36.0)
MCV: 69.9 fL — AB (ref 78.0–100.0)
Monocytes Absolute: 1.3 10*3/uL — ABNORMAL HIGH (ref 0.1–1.0)
Monocytes Relative: 12 % (ref 3–12)
NEUTROS ABS: 8.6 10*3/uL — AB (ref 1.7–7.7)
Neutrophils Relative %: 76 % (ref 43–77)
PLATELETS: 200 10*3/uL (ref 150–400)
RBC: 3.95 MIL/uL (ref 3.87–5.11)
RDW: 19.4 % — AB (ref 11.5–15.5)
WBC: 11.2 10*3/uL — AB (ref 4.0–10.5)

## 2014-02-07 LAB — POC OCCULT BLOOD, ED: Fecal Occult Bld: POSITIVE — AB

## 2014-02-07 LAB — POCT RAPID STREP A: STREPTOCOCCUS, GROUP A SCREEN (DIRECT): NEGATIVE

## 2014-02-07 MED ORDER — CEFTRIAXONE SODIUM 1 G IJ SOLR: 1.0000 g | Freq: Once | INTRAMUSCULAR | Status: DC

## 2014-02-07 MED ORDER — DEXAMETHASONE SODIUM PHOSPHATE 10 MG/ML IJ SOLN
10.0000 mg | Freq: Once | INTRAMUSCULAR | Status: AC
  Administered 2014-02-07: 10 mg via INTRAVENOUS
  Filled 2014-02-07: qty 1

## 2014-02-07 MED ORDER — SODIUM CHLORIDE 0.9 % IV BOLUS (SEPSIS)
1000.0000 mL | Freq: Once | INTRAVENOUS | Status: AC
  Administered 2014-02-07: 1000 mL via INTRAVENOUS

## 2014-02-07 MED ORDER — ACETAMINOPHEN 325 MG PO TABS
650.0000 mg | ORAL_TABLET | Freq: Once | ORAL | Status: AC
  Administered 2014-02-07: 650 mg via ORAL

## 2014-02-07 MED ORDER — CEFTRIAXONE SODIUM 1 G IJ SOLR
INTRAMUSCULAR | Status: AC
  Filled 2014-02-07: qty 10

## 2014-02-07 MED ORDER — SODIUM CHLORIDE 0.9 % IV SOLN
Freq: Once | INTRAVENOUS | Status: AC
  Administered 2014-02-07: 16:00:00 via INTRAVENOUS

## 2014-02-07 MED ORDER — CEFTRIAXONE SODIUM 1 G IJ SOLR
1.0000 g | Freq: Once | INTRAMUSCULAR | Status: AC
  Administered 2014-02-07: 1 g via INTRAMUSCULAR

## 2014-02-07 MED ORDER — ONDANSETRON HCL 4 MG/2ML IJ SOLN
INTRAMUSCULAR | Status: AC
  Filled 2014-02-07: qty 2

## 2014-02-07 MED ORDER — IBUPROFEN 400 MG PO TABS
600.0000 mg | ORAL_TABLET | Freq: Once | ORAL | Status: AC
  Administered 2014-02-07: 600 mg via ORAL
  Filled 2014-02-07 (×2): qty 1

## 2014-02-07 MED ORDER — ONDANSETRON HCL 4 MG/2ML IJ SOLN
4.0000 mg | Freq: Once | INTRAMUSCULAR | Status: AC
  Administered 2014-02-07: 4 mg via INTRAVENOUS

## 2014-02-07 NOTE — ED Notes (Signed)
Blankets removed from patient and nurse advised of patients temp.

## 2014-02-07 NOTE — ED Notes (Signed)
Pt. Arrived via shuttle from our Urgent Care was treated there for sore throat and fever. Received 1 liter of NSS and also tylenol  When labs resulted pt.'s labs were abnormal.  Pt. Is pale and chilled, Also having a mild headache. Pt. Is alert and oriented X3

## 2014-02-07 NOTE — ED Provider Notes (Signed)
CSN: [id number]     Arrival date & time 02/07/14  1748 History   First MD Initiated Contact with Patient 02/07/14 2039     Chief Complaint  Patient presents with  . Abnormal Lab     (Consider location/radiation/quality/duration/timing/severity/associated sxs/prior Treatment) Patient is a 28 y.o. female presenting with general illness. The history is provided by the patient and medical records.  Illness Severity:  Severe Onset quality:  Gradual Timing:  Constant Progression:  Worsening Chronicity:  New Associated symptoms: abdominal pain (mild earlier today. left side. resolved PTA. ), cough, diarrhea, fever, nausea, shortness of breath (mild. not currently.) and vomiting   Associated symptoms: no chest pain, no congestion, no headaches, no rash and no rhinorrhea     28 yo F pw sore throat and anemia.  ST x5 days. Fever during this time as well. Uncertain tmax. Not taking tylenol or motrin. No voice change. Hurts to swallow but still tolerating po. Tolerating secretions. No stridor. Diagnosed with pharyngitis after negative strep 5 days ago. But started on amoxicillin. Occasional emesis after amoxicillin, but usually not for a while afterwards. Believes she was keeping it down for the most part. No emesis past couple days. Mild loose stool x1. NB.  Mild cough. Non-productive.  No dysuria or hematuria.  Currently on period with minimal spotting. Normal period last month. H/o tubal ligation.  Reports h/o anemia in the past. Was previously on iron supplements, but not any longer.   History reviewed. No pertinent past medical history. Past Surgical History  Procedure Laterality Date  . Tubal ligation  2011   No family history on file. History  Substance Use Topics  . Smoking status: Light Tobacco Smoker    Types: Cigarettes  . Smokeless tobacco: Not on file  . Alcohol Use: Yes     Comment: occasional   OB History   Grav Para Term Preterm Abortions TAB SAB Ect Mult Living            Review of Systems  Constitutional: Positive for fever and chills.  HENT: Positive for trouble swallowing (painful). Negative for congestion and rhinorrhea.   Eyes: Negative for visual disturbance.  Respiratory: Positive for cough and shortness of breath (mild. not currently.).   Cardiovascular: Negative for chest pain and leg swelling.  Gastrointestinal: Positive for nausea, vomiting, abdominal pain (mild earlier today. left side. resolved PTA. ) and diarrhea.  Genitourinary: Negative for dysuria, hematuria, flank pain and difficulty urinating.  Skin: Negative for color change and rash.  Neurological: Negative for dizziness and headaches.  All other systems reviewed and are negative.     Allergies  Review of patient's allergies indicates no known allergies.  Home Medications   Prior to Admission medications   Not on File   BP 124/71  Pulse 81  Temp(Src) 98.9 F (37.2 C) (Oral)  Resp 18  Ht 5\' 2"  (1.575 m)  Wt 118 lb (53.524 kg)  BMI 21.58 kg/m2  SpO2 92%  LMP 02/07/2014 Physical Exam  Nursing note and vitals reviewed. Constitutional: She is oriented to person, place, and time. She appears well-developed and well-nourished. No distress.  HENT:  Head: Normocephalic and atraumatic.  Mouth/Throat: Uvula is midline. No trismus in the jaw. No uvula swelling. Oropharyngeal exudate present. No tonsillar abscesses.  Submandibular compartment soft.  Eyes: Conjunctivae are normal. Right eye exhibits no discharge. Left eye exhibits no discharge.  Neck: Normal range of motion. Neck supple. No tracheal deviation present.  Cardiovascular: Regular rhythm, normal heart sounds and  intact distal pulses.   Tachycardic to 105  Pulmonary/Chest: Effort normal and breath sounds normal. No stridor. No respiratory distress. She has no wheezes. She has no rales.  Abdominal: Soft. She exhibits no distension. There is no tenderness. There is no guarding.  Musculoskeletal: She exhibits no  edema and no tenderness.  Lymphadenopathy:    She has cervical adenopathy (tender ant b/l).  Neurological: She is alert and oriented to person, place, and time.  Skin: Skin is warm and dry.  Psychiatric: She has a normal mood and affect. Her behavior is normal.    ED Course  Procedures (including critical care time) Labs Review Labs Reviewed  CBC - Abnormal; Notable for the following:    WBC 11.6 (*)    RBC 3.49 (*)    Hemoglobin 7.2 (*)    HCT 24.5 (*)    MCV 70.2 (*)    MCH 20.6 (*)    MCHC 29.4 (*)    RDW 19.6 (*)    All other components within normal limits  BASIC METABOLIC PANEL - Abnormal; Notable for the following:    Sodium 135 (*)    Chloride 95 (*)    All other components within normal limits  RETICULOCYTES - Abnormal; Notable for the following:    RBC. 3.23 (*)    All other components within normal limits  POC OCCULT BLOOD, ED - Abnormal; Notable for the following:    Fecal Occult Bld POSITIVE (*)    All other components within normal limits  URINALYSIS, ROUTINE W REFLEX MICROSCOPIC  VITAMIN B12  FOLATE  IRON AND TIBC  FERRITIN  LACTATE DEHYDROGENASE  POC URINE PREG, ED  TYPE AND SCREEN    Imaging Review Dg Chest 2 View  02/07/2014   CLINICAL DATA:  Short of breath.  Nausea and vomiting.  EXAM: CHEST  2 VIEW  COMPARISON:  11/05/2005.  FINDINGS: The heart size and mediastinal contours are within normal limits. Both lungs are clear. The visualized skeletal structures are unremarkable.  IMPRESSION: No active cardiopulmonary disease.   Electronically Signed   By: Andreas NewportGeoffrey  Lamke M.D.   On: 02/07/2014 22:11     EKG Interpretation None      MDM   Final diagnoses:  Pharyngitis  Anemia    Anemia and pharyngitis hgb decreased by 1 from urgent care earlier.  No h/o GI bleed. No blood in stool or dark stools.  hemocult positive, but patient also on her period.  Tachycardia improved with antipyretics. Receiving IVF.  Discussed with hospitalists for  observation admission to monitor hgb.  Decadron for pharyngitis. Without evidence of PTA, RPA, ludwigs, dental abscess, or other emergent pathology. Do not believe imaging indicated at this time.  No tick exposure.  Abd exam benign.  UA pending, but without dysuria or hematuria.  cxr clear.  Labs and imaging reviewed by myself and considered in medical decision making if ordered. Imaging interpreted by radiology.   Discussed case with Dr. Rubin PayorPickering who is in agreement with assessment and plan.     Stevie Kernyan McLennan, MD 02/08/14 [phone]

## 2014-02-07 NOTE — ED Notes (Signed)
Pt  Reports  Symptoms  Of  sorethroat  With  Fever  Redness  And  Exudate           Seen  sev  Days  Ago   For  Same          Pt   Reports  Symptoms  Getting  Worse         Also  Reports  Weakness  And  abd  Pain

## 2014-02-07 NOTE — ED Notes (Signed)
Pt states unable to void at this point.

## 2014-02-07 NOTE — ED Notes (Signed)
Pt  Feels  Better iv  Converted  To  Saline  Lock        Pt  Will  Be  transfererd  To  [facility] by  shuttle

## 2014-02-07 NOTE — ED Provider Notes (Signed)
CSN: [id number]     Arrival date & time 02/07/14  1256 History   First MD Initiated Contact with Patient 02/07/14 1425     Chief Complaint  Patient presents with  . Sore Throat   (Consider location/radiation/quality/duration/timing/severity/associated sxs/prior Treatment) Patient is a 28 y.o. female presenting with pharyngitis. The history is provided by the patient.  Sore Throat This is a new problem. The current episode started more than 2 days ago (seen 6/12 for tonsillitis, strep neg, given amox and medrol pack, didn"t take medrol for cost reasons, , sx getting worse with n/v and fever.). The problem has been gradually worsening. Associated symptoms include headaches.    History reviewed. No pertinent past medical history. Past Surgical History  Procedure Laterality Date  . Tubal ligation  2011   History reviewed. No pertinent family history. History  Substance Use Topics  . Smoking status: Light Tobacco Smoker    Types: Cigarettes  . Smokeless tobacco: Not on file  . Alcohol Use: Yes     Comment: occasional   OB History   Grav Para Term Preterm Abortions TAB SAB Ect Mult Living                 Review of Systems  Constitutional: Positive for fever, chills and appetite change.  HENT: Positive for sore throat.   Respiratory: Negative.   Cardiovascular: Negative.   Gastrointestinal: Positive for nausea and vomiting.  Neurological: Positive for headaches.    Allergies  Review of patient's allergies indicates no known allergies.  Home Medications   Prior to Admission medications   Medication Sig Start Date End Date Taking? Authorizing Provider  amoxicillin (AMOXIL) 875 MG tablet Take 1 tablet (875 mg total) by mouth 2 (two) times daily. 02/03/14   Adrian BlackwaterZachary H Baker, PA-C  Chlorphen-Pseudoephed-APAP (THERAFLU FLU/COLD PO) Take by mouth.    Historical Provider, MD  doxycycline (VIBRAMYCIN) 100 MG capsule Take 1 capsule (100 mg total) by mouth 2 (two) times daily. One po  bid x 7 days 03/25/13   Rolan BuccoMelanie Belfi, MD  fluconazole (DIFLUCAN) 150 MG tablet Take once, then repeat in two weeks. 05/11/12   Geoffery Lyonsouglas Delo, MD  fluconazole (DIFLUCAN) 150 MG tablet Take 1 tablet (150 mg total) by mouth once. 05/28/12   Elson AreasLeslie K Sofia, PA-C  ibuprofen (ADVIL,MOTRIN) 800 MG tablet Take 1 tablet (800 mg total) by mouth every 8 (eight) hours as needed for fever or moderate pain. 02/03/14   Graylon GoodZachary H Baker, PA-C  methylPREDNISolone (MEDROL DOSEPAK) 4 MG tablet Use as directed on package instructions 02/03/14   Graylon GoodZachary H Baker, PA-C  metroNIDAZOLE (FLAGYL) 500 MG tablet Take 1 tablet (500 mg total) by mouth 2 (two) times daily. One po bid x 7 days 05/11/12   Geoffery Lyonsouglas Delo, MD  metroNIDAZOLE (FLAGYL) 500 MG tablet Take 1 tablet (500 mg total) by mouth 2 (two) times daily. One po bid x 7 days 03/25/13   Rolan BuccoMelanie Belfi, MD  sulfamethoxazole-trimethoprim (BACTRIM DS,SEPTRA DS) 800-160 MG per tablet Take 1 tablet by mouth 2 (two) times daily. 05/11/12   Geoffery Lyonsouglas Delo, MD   BP 124/81  Pulse 114  Temp(Src) 102.8 F (39.3 C) (Oral)  Resp 16  SpO2 100%  LMP 02/07/2014 Physical Exam  Nursing note and vitals reviewed. Constitutional: She appears well-developed and well-nourished.  HENT:  Right Ear: External ear normal.  Left Ear: External ear normal.  Mouth/Throat: Uvula is midline and mucous membranes are normal. Oropharyngeal exudate and posterior oropharyngeal erythema present. No tonsillar abscesses.  Eyes: Conjunctivae and EOM are normal. Pupils are equal, round, and reactive to light.  Neck: Normal range of motion. Neck supple.  Lymphadenopathy:    She has cervical adenopathy.    ED Course  Procedures (including critical care time) Labs Review Labs Reviewed  CBC WITH DIFFERENTIAL  POCT RAPID STREP A ([facility] CARE ONLY)  POCT INFECTIOUS MONO SCREEN    Imaging Review No results found.   MDM  No diagnosis found. Marked pharyngitis for 5d , not improving with amox , sl improved sx  with ivf, rocephin , profound anemia. Sent for further eval.    James D KindLinna Hoffl, MD 02/07/14 [phone]

## 2014-02-08 ENCOUNTER — Encounter (HOSPITAL_COMMUNITY): Payer: Self-pay | Admitting: Internal Medicine

## 2014-02-08 ENCOUNTER — Inpatient Hospital Stay (HOSPITAL_COMMUNITY): Payer: Medicaid Other

## 2014-02-08 ENCOUNTER — Telehealth: Payer: Self-pay

## 2014-02-08 DIAGNOSIS — R000 Tachycardia, unspecified: Secondary | ICD-10-CM | POA: Diagnosis present

## 2014-02-08 DIAGNOSIS — D649 Anemia, unspecified: Secondary | ICD-10-CM

## 2014-02-08 DIAGNOSIS — IMO0002 Reserved for concepts with insufficient information to code with codable children: Secondary | ICD-10-CM | POA: Diagnosis not present

## 2014-02-08 DIAGNOSIS — N920 Excessive and frequent menstruation with regular cycle: Secondary | ICD-10-CM | POA: Diagnosis present

## 2014-02-08 DIAGNOSIS — J029 Acute pharyngitis, unspecified: Secondary | ICD-10-CM | POA: Diagnosis present

## 2014-02-08 DIAGNOSIS — E041 Nontoxic single thyroid nodule: Secondary | ICD-10-CM | POA: Diagnosis present

## 2014-02-08 DIAGNOSIS — R112 Nausea with vomiting, unspecified: Secondary | ICD-10-CM | POA: Diagnosis present

## 2014-02-08 DIAGNOSIS — K2990 Gastroduodenitis, unspecified, without bleeding: Secondary | ICD-10-CM | POA: Diagnosis present

## 2014-02-08 DIAGNOSIS — K219 Gastro-esophageal reflux disease without esophagitis: Secondary | ICD-10-CM | POA: Diagnosis present

## 2014-02-08 DIAGNOSIS — F17200 Nicotine dependence, unspecified, uncomplicated: Secondary | ICD-10-CM | POA: Diagnosis present

## 2014-02-08 DIAGNOSIS — D509 Iron deficiency anemia, unspecified: Secondary | ICD-10-CM | POA: Diagnosis present

## 2014-02-08 DIAGNOSIS — K2970 Gastritis, unspecified, without bleeding: Secondary | ICD-10-CM | POA: Diagnosis present

## 2014-02-08 LAB — COMPREHENSIVE METABOLIC PANEL
ALBUMIN: 3.4 g/dL — AB (ref 3.5–5.2)
ALT: 19 U/L (ref 0–35)
AST: 23 U/L (ref 0–37)
Alkaline Phosphatase: 101 U/L (ref 39–117)
BUN: 9 mg/dL (ref 6–23)
CALCIUM: 9.1 mg/dL (ref 8.4–10.5)
CO2: 21 mEq/L (ref 19–32)
CREATININE: 0.51 mg/dL (ref 0.50–1.10)
Chloride: 103 mEq/L (ref 96–112)
GFR calc Af Amer: >90 mL/min (ref >90)
GFR calc non Af Amer: >90 mL/min (ref >90)
Glucose, Bld: 150 mg/dL — ABNORMAL HIGH (ref 70–99)
Potassium: 4.7 mEq/L (ref 3.7–5.3)
SODIUM: 138 meq/L (ref 137–147)
Total Bilirubin: 0.4 mg/dL (ref 0.3–1.2)
Total Protein: 7.4 g/dL (ref 6.0–8.3)

## 2014-02-08 LAB — PREPARE RBC (CROSSMATCH)

## 2014-02-08 LAB — URINE MICROSCOPIC-ADD ON

## 2014-02-08 LAB — CBC
HCT: 26.9 % — ABNORMAL LOW (ref 36.0–46.0)
HEMATOCRIT: 26.2 % — AB (ref 36.0–46.0)
HEMOGLOBIN: 7.9 g/dL — AB (ref 12.0–15.0)
Hemoglobin: 7.7 g/dL — ABNORMAL LOW (ref 12.0–15.0)
MCH: 21 pg — AB (ref 26.0–34.0)
MCH: 20.7 pg — ABNORMAL LOW (ref 26.0–34.0)
MCHC: 29.4 g/dL — ABNORMAL LOW (ref 30.0–36.0)
MCHC: 29.4 g/dL — AB (ref 30.0–36.0)
MCV: 71.5 fL — ABNORMAL LOW (ref 78.0–100.0)
MCV: 70.4 fL — AB (ref 78.0–100.0)
Platelets: 209 10*3/uL (ref 150–400)
Platelets: 181 10*3/uL (ref 150–400)
RBC: 3.76 MIL/uL — AB (ref 3.87–5.11)
RBC: 3.72 MIL/uL — ABNORMAL LOW (ref 3.87–5.11)
RDW: 20.1 % — ABNORMAL HIGH (ref 11.5–15.5)
RDW: 19.8 % — AB (ref 11.5–15.5)
WBC: 8.2 10*3/uL (ref 4.0–10.5)
WBC: 8.1 10*3/uL (ref 4.0–10.5)

## 2014-02-08 LAB — URINALYSIS, ROUTINE W REFLEX MICROSCOPIC
BILIRUBIN URINE: NEGATIVE
Glucose, UA: NEGATIVE mg/dL
Ketones, ur: >80 mg/dL — AB
Leukocytes, UA: NEGATIVE
NITRITE: NEGATIVE
Protein, ur: NEGATIVE mg/dL
SPECIFIC GRAVITY, URINE: 1.018 (ref 1.005–1.030)
Urobilinogen, UA: 1 mg/dL (ref 0.0–1.0)
pH: 6 (ref 5.0–8.0)

## 2014-02-08 LAB — FERRITIN: Ferritin: 25 ng/mL (ref 10–291)

## 2014-02-08 LAB — RETICULOCYTES
RBC.: 3.23 MIL/uL — ABNORMAL LOW (ref 3.87–5.11)
RETIC CT PCT: 0.6 % (ref 0.4–3.1)
Retic Count, Absolute: 19.4 10*3/uL (ref 19.0–186.0)

## 2014-02-08 LAB — IRON AND TIBC
Iron: <10 ug/dL — ABNORMAL LOW (ref 42–135)
UIBC: 313 ug/dL (ref 125–400)

## 2014-02-08 LAB — PREGNANCY, URINE: PREG TEST UR: NEGATIVE

## 2014-02-08 LAB — FOLATE: Folate: 10.8 ng/mL

## 2014-02-08 LAB — HIV ANTIBODY (ROUTINE TESTING W REFLEX): HIV 1&2 Ab, 4th Generation: NONREACTIVE

## 2014-02-08 LAB — VITAMIN B12: Vitamin B-12: 587 pg/mL (ref 211–911)

## 2014-02-08 LAB — LACTATE DEHYDROGENASE: LDH: 175 U/L (ref 94–250)

## 2014-02-08 LAB — RAPID STREP SCREEN (MED CTR MEBANE ONLY): Streptococcus, Group A Screen (Direct): NEGATIVE

## 2014-02-08 MED ORDER — ACETAMINOPHEN 325 MG PO TABS: 650.0000 mg | ORAL_TABLET | Freq: Once | ORAL | Status: DC

## 2014-02-08 MED ORDER — PANTOPRAZOLE SODIUM 40 MG IV SOLR
40.0000 mg | Freq: Two times a day (BID) | INTRAVENOUS | Status: DC
  Administered 2014-02-08 – 2014-02-09 (×3): 40 mg via INTRAVENOUS
  Filled 2014-02-08 (×4): qty 40

## 2014-02-08 MED ORDER — DIPHENHYDRAMINE HCL 50 MG/ML IJ SOLN: 25.0000 mg | Freq: Once | INTRAMUSCULAR | Status: DC

## 2014-02-08 MED ORDER — DEXTROSE 5 % IV SOLN
1.0000 g | INTRAVENOUS | Status: DC
  Administered 2014-02-08 – 2014-02-09 (×2): 1 g via INTRAVENOUS
  Filled 2014-02-08 (×3): qty 10

## 2014-02-08 MED ORDER — SODIUM CHLORIDE 0.9 % IV SOLN: 750.0000 mg | Freq: Every day | INTRAVENOUS | Status: DC

## 2014-02-08 MED ORDER — MORPHINE SULFATE 2 MG/ML IJ SOLN: 1.0000 mg | INTRAMUSCULAR | Status: DC | PRN

## 2014-02-08 MED ORDER — IOHEXOL 300 MG/ML  SOLN
75.0000 mL | Freq: Once | INTRAMUSCULAR | Status: AC | PRN
  Administered 2014-02-08: 75 mL via INTRAVENOUS

## 2014-02-08 MED ORDER — SODIUM CHLORIDE 0.9 % IV SOLN
INTRAVENOUS | Status: DC
  Administered 2014-02-09: 1000 mL via INTRAVENOUS

## 2014-02-08 MED ORDER — FUROSEMIDE 10 MG/ML IJ SOLN: 20.0000 mg | Freq: Once | INTRAMUSCULAR | Status: DC

## 2014-02-08 MED ORDER — ACETAMINOPHEN 325 MG PO TABS: 650.0000 mg | ORAL_TABLET | Freq: Four times a day (QID) | ORAL | Status: DC | PRN

## 2014-02-08 MED ORDER — SODIUM CHLORIDE 0.9 % IV SOLN
25.0000 mg | Freq: Once | INTRAVENOUS | Status: AC
  Administered 2014-02-08: 25 mg via INTRAVENOUS
  Filled 2014-02-08 (×2): qty 0.5

## 2014-02-08 MED ORDER — ACETAMINOPHEN 650 MG RE SUPP: 650.0000 mg | Freq: Four times a day (QID) | RECTAL | Status: DC | PRN

## 2014-02-08 MED ORDER — SODIUM CHLORIDE 0.9 % IV SOLN
750.0000 mg | Freq: Every day | INTRAVENOUS | Status: DC
  Filled 2014-02-08: qty 15

## 2014-02-08 MED ORDER — ONDANSETRON HCL 4 MG/2ML IJ SOLN: 4.0000 mg | Freq: Four times a day (QID) | INTRAMUSCULAR | Status: DC | PRN

## 2014-02-08 MED ORDER — SODIUM CHLORIDE 0.9 % IV SOLN: INTRAVENOUS | Status: DC

## 2014-02-08 MED ORDER — MORPHINE SULFATE 2 MG/ML IJ SOLN: 0.5000 mg | INTRAMUSCULAR | Status: DC | PRN

## 2014-02-08 MED ORDER — SODIUM CHLORIDE 0.9 % IV SOLN
INTRAVENOUS | Status: DC
  Administered 2014-02-08: 02:00:00 via INTRAVENOUS

## 2014-02-08 MED ORDER — GI COCKTAIL ~~LOC~~
30.0000 mL | Freq: Three times a day (TID) | ORAL | Status: DC | PRN
Start: 2014-02-08 — End: 2014-02-10
  Administered 2014-02-08: 30 mL via ORAL
  Filled 2014-02-08: qty 30

## 2014-02-08 MED ORDER — SODIUM CHLORIDE 0.9 % IV SOLN
650.0000 mg | Freq: Every day | INTRAVENOUS | Status: AC
  Administered 2014-02-08 – 2014-02-09 (×2): 650 mg via INTRAVENOUS
  Filled 2014-02-08 (×4): qty 13

## 2014-02-08 MED ORDER — ONDANSETRON HCL 4 MG PO TABS: 4.0000 mg | ORAL_TABLET | Freq: Four times a day (QID) | ORAL | Status: DC | PRN

## 2014-02-08 NOTE — H&P (Addendum)
[facility] History and Physical  Meghan Libmanundria D Lodes ZOX:[id number] DOB: [birth year] DOA: 02/07/2014  Referring physician: ER physician. PCP: No PCP Per Patient   Chief Complaint: Sore throat and anemia.  HPI: Meghan Libmanundria D Doo is a 28 y.o. female was referred to the ER after patient was having pain in her throat with fever and nausea vomiting and was found to be anemic. Patient has been having some throat discomfort for last few days and 5 days ago she had gone to urgent care and was prescribed Augmentin and steroid. Patient was unable to take steroid taper because of cost issues. Last 2 days patient started developing increasing pain and difficulty swallowing. Patient did not have any difficulty breathing. Patient also had nausea vomiting whenever she eats anything. Patient developed some pain around her right side of the neck. Patient in the ER was found to be febrile with temperatures around 102F and on exam patient has exudates on her tonsils. There is no obvious neck rigidity. Patient has no shortness of breath but occasionally develops chest pain when she takes deep breath. Chest x-ray is unremarkable. Patient in addition is found to be anemic and patient states she has been in the previously when she was told she had had deficiency when she was pregnant a few years ago. Patient has not noticed any obvious rectal bleeding or black stools and also states he takes Goody powder for toothache for long time. Patient presently is on her menstrual cycle. Stool for occult blood is positive. On exam abdomen appears benign and nontender.   Review of Systems: As presented in the history of presenting illness, rest negative.  Past Medical History  Diagnosis Date  . Anemia    Past Surgical History  Procedure Laterality Date  . Tubal ligation  2011   Social History:  reports that she has been smoking Cigarettes.  She has been smoking about 0.00 packs per day. She does not have any smokeless tobacco  history on file. She reports that she drinks alcohol. She reports that she does not use illicit drugs. Where does patient live home. Can patient participate in ADLs? Yes.  No Known Allergies  Family History:  Family History  Problem Relation Age of Onset  . CAD Other   . Cancer Other   . Diabetes Mellitus II Neg Hx   . Anemia Neg Hx       Prior to Admission medications   Not on File    Physical Exam: Filed Vitals:   02/07/14 2345 02/08/14 0000 02/08/14 0015 02/08/14 0038  BP: 116/58 111/59 113/59 113/74  Pulse: 97 95 98 100  Temp:    98.6 F (37 C)  TempSrc:    Oral  Resp:    17  Height:    5\' 2"  (1.575 m)  Weight:    53.298 kg (117 lb 8 oz)  SpO2: 100% 100% 100% 99%     General:  Moderately built and nourished.  Eyes: Pallor present no icterus.  ENT: Exudates seen on the both tonsils.  Neck: Mild tenderness in the jugulodigastric area. No neck rigidity.  Cardiovascular: S1-S2 heard.  Respiratory: No rhonchi or crepitations.  Abdomen: Soft nontender bowel sounds present. No guarding or rigidity  Skin: No rash.  Musculoskeletal: No edema.  Psychiatric: Appears normal.  Neurologic: Alert oriented to time place and person. Moves all extremities.  Labs on Admission:  Basic Metabolic Panel:  Recent Labs Lab 02/07/14 2138  NA 135*  K 4.1  CL 95*  CO2 22  GLUCOSE 93  BUN 9  CREATININE 0.73  CALCIUM 8.6   Liver Function Tests: No results found for this basename: AST, ALT, ALKPHOS, BILITOT, PROT, ALBUMIN,  in the last 168 hours No results found for this basename: LIPASE, AMYLASE,  in the last 168 hours No results found for this basename: AMMONIA,  in the last 168 hours CBC:  Recent Labs Lab 02/07/14 1530 02/07/14 2138  WBC 11.2* 11.6*  NEUTROABS 8.6*  --   HGB 8.2* 7.2*  HCT 27.6* 24.5*  MCV 69.9* 70.2*  PLT 200 194   Cardiac Enzymes: No results found for this basename: CKTOTAL, CKMB, CKMBINDEX, TROPONINI,  in the last 168 hours  BNP  (last 3 results) No results found for this basename: PROBNP,  in the last 8760 hours CBG: No results found for this basename: GLUCAP,  in the last 168 hours  Radiological Exams on Admission: Dg Chest 2 View  02/07/2014   CLINICAL DATA:  Short of breath.  Nausea and vomiting.  EXAM: CHEST  2 VIEW  COMPARISON:  11/05/2005.  FINDINGS: The heart size and mediastinal contours are within normal limits. Both lungs are clear. The visualized skeletal structures are unremarkable.  IMPRESSION: No active cardiopulmonary disease.   Electronically Signed   By: Andreas NewportGeoffrey  Lamke M.D.   On: 02/07/2014 22:11    Assessment/Plan Principal Problem:   Pharyngitis Active Problems:   Anemia   Nausea with vomiting   1. Exudative pharyngitis - I have ordered CT of the neck with contrast to check for any developing abscess. Since patient has been already started on ceftriaxone I have continued the same. Check blood cultures and strep antigen. Presently I have placed patient on clear liquid diet and based on patient's clinical course CT findings we need to further evaluate if patient needs ENT consult. 2. Anemia microcytic hypochromic with stool for occult blood positive - follow CBC closely.Consulted GI Dr.Hung. Check anemia panel. I have placed patient on Protonix IV. 3. Nausea and vomiting - probably related to #1. Abdomen appears benign. 4. Tobacco abuse - patient advised to quit smoking.    Code Status: Full code.  Family Communication: None.  Disposition Plan: Admit to inpatient.    KAKRAKANDY,ARSHAD N. [facility] Pager [phone].  If 7PM-7AM, please contact night-coverage [email or web address] Password [email or web address] 02/08/2014, 1:39 AM

## 2014-02-08 NOTE — ED Provider Notes (Signed)
Medical screening examination/treatment/procedure(s) were performed by a resident physician or non-physician practitioner and as the supervising physician I was immediately available for consultation/collaboration.  Clementeen GrahamEvan Jearlene Bridwell, MD    Rodolph BongEvan S Kycen Spalla, MD 02/08/14 [phone]

## 2014-02-08 NOTE — ED Provider Notes (Signed)
I saw and evaluated the patient, reviewed the resident's note and I agree with the findings and plan.   EKG Interpretation None     Patient pharyngitis and anemia.  she has difficult to control pain. she also has anemia. Guaiac positive but also maybe do to her menses. Her hemoglobin has decreased since trauma earlier today. Will admit  Meghan Bennett PayorPickering, MD 02/08/14 1452

## 2014-02-08 NOTE — Telephone Encounter (Signed)
Pt would like to set up an Est [facility] visit. Pt is in Hospital at this time and is aware of the wait time to recieve apointment.

## 2014-02-08 NOTE — Progress Notes (Signed)
MD and RN at patient's bedside. Patient expressing concerns about blood transfusion. MD provided education about blood transfusion to patient including benefits and potential risks. RN explained process of transfusion. Patient refusing blood at this time. Patient's chest pain is relieved. MD aware.

## 2014-02-08 NOTE — Discharge Summary (Signed)
Physician Discharge Summary  Meghan Libmanundria D Hollings ZOX:[id number] DOB: [birth year] DOA: 02/07/2014  PCP: No PCP Per Patient  Admit date: 02/07/2014 Discharge date: 02/09/2014  Time spent: 40 minutes  Recommendations for Outpatient Follow-up:  1. CBC, in one week 2. Left thyroid lobe nodule - recommend PCP referral to endocrinologist 3. GYN follow up regarding menorrhagia - pelvic and transvaginal ultrasound reveal no abnormality   Discharge Diagnoses:  Principal Problem:   Pharyngitis Active Problems:   Anemia   Nausea with vomiting   Menorrhagia   Discharge Condition: Stable   Diet recommendation: Heart Healthy    Filed Weights   02/07/14 1809 02/08/14 0038  Weight: 53.524 kg (118 lb) 53.298 kg (117 lb 8 oz)    History of present illness:  Meghan Bennett is a 28 y.o. female with 5 day history of sore throat with fever and nausea and vomiting. Patient is also anemic and was told she was anemic three years ago. Patient states menorrhagia and using 3-5 pads per day for 7 days when on cycle. Today patient states still feeling fatigued but throat continuing to feel better. No difficulties swallowing or eating. Having chest pain, denied blood transfusion.    Hospital Course:  Principle problem Exudative Pharyngitis Suspect Strep -even though Rapid strep negative, and culture negative. CT neck reveals inflamed palantine tonsils with no abscess.  Treated with Ceftriaxone and discharged with po Clindamycin, and Probiotic  Improved, no odynphagia or drooling  Microcytic Anemia  suspect long standing anemia-given hx of menorrhagia, however recently took Goody powder for pain, hemoccult positive as well. Pelvic/Transvaginal ultrasound neg for major abnormalities.  Upper Endoscopy was + for gastritis.  Negative for bleeding or ulceration.  She will need outpatient eval by GYN  Given IV Fe supplementation Patient denied blood transfusion Avoid NSAIDs  Chest Pain  suspect atypical-?GERD-try  GI cocktail for now  EKG neg  Patient denied blood transfusion  Nausea with vomiting  Most likely due to pharyngitis  Strep culture pending  Resolved 6/17   Thyroid Nodule  - 13mm left thyroid lobe nodule seen - needs to be worked up further as outpatient   Procedures:  Upper Endoscopy 6/18 - Dr. Elnoria HowardHung Upper Endoscopy was + for gastritis.  Negative for bleeding or ulceration.  Consultations:  Gastroenterology   Discharge Exam: Filed Vitals:   02/09/14 1600  BP: 125/82  Pulse: 85  Temp:   Resp: 16   Exam General: Well developed, well nourished, sleepy Eyes: Pallor present no icterus. ENT: Pharynx erythmetous with exudate on tonsils  Neck: Supple, non-tender, mobile superior cervical lympadenopathy  Cardiovascular: RRR, no murmurs, rubs or gallops  Respiratory: Clear to auscultation with equal chest rise  Abdomen: Soft nontender, nondistended, positive bowel sounds Skin: No rash or edema .  Psychiatric: Appears normal.  Neurologic: Alert oriented to time place and person. Moves all extremities.  Discharge Instructions      Discharge Instructions   Diet - low sodium heart healthy    Complete by:  As directed      Increase activity slowly    Complete by:  As directed             Medication List         acidophilus Caps capsule  Take 2 capsules by mouth daily.     clindamycin 300 MG capsule  Commonly known as:  CLEOCIN  Take 1 capsule (300 mg total) by mouth 3 (three) times daily.     ferrous sulfate 325 (65 FE)  MG tablet  Take 1 tablet (325 mg total) by mouth 2 (two) times daily with a meal.     omeprazole 40 MG capsule  Commonly known as:  PRILOSEC  Take 1 capsule (40 mg total) by mouth daily.       No Known Allergies Follow-up Information   Follow up with MATTHEWS,MICHELLE A., MD On 04/03/2014. (2:15 bring $20 co pay)    Specialty:  Internal Medicine   Contact information:   [address] [place] [address] [phone]        [facility]     On 03/01/2014. (3 pm for orange card apt)    Contact information:   [address] [place] [address] [phone]      [facility]. (they will call you and make apt , the apts are all the way out until August)    Specialty:  Obstetrics and Gynecology   Contact information:   [address] [place] [phone]      Please follow up. (Dentist for tooth pain.)        The results of significant diagnostics from this hospitalization (including imaging, microbiology, ancillary and laboratory) are listed below for reference.    Significant Diagnostic Studies: Dg Chest 2 View  02/07/2014   CLINICAL DATA:  Short of breath.  Nausea and vomiting.  EXAM: CHEST  2 VIEW  COMPARISON:  11/05/2005.  FINDINGS: The heart size and mediastinal contours are within normal limits. Both lungs are clear. The visualized skeletal structures are unremarkable.  IMPRESSION: No active cardiopulmonary disease.   Electronically Signed   By: Andreas NewportGeoffrey  Lamke M.D.   On: 02/07/2014 22:11   Ct Soft Tissue Neck W Contrast  02/08/2014   CLINICAL DATA:  Sore throat right greater than left. Improving with IV antibiotics.  EXAM: CT NECK WITH CONTRAST  TECHNIQUE: Multidetector CT imaging of the neck was performed using the standard protocol following the bolus administration of intravenous contrast.  CONTRAST:  75mL OMNIPAQUE IOHEXOL 300 MG/ML  SOLN  COMPARISON:  None.  FINDINGS: The palatine tonsils are enlarged and heterogeneous attenuation, with the right slightly larger than the left. There is no formed fluid collection to suggest a tonsillar abscess. The parapharyngeal fat is well preserved bilaterally.  There is bilateral adenopathy along the jugular chains, the largest node 80 right level-II lymph node measuring 12 mm in short axis.  A 13 mm hypo attenuating lesion lies in the left thyroid lobe.  No other  abnormalities. Lung apices are clear. Airway is widely patent. Visualized portions of the globes and orbits are unremarkable. There is a small focus of mucosal thickening versus a mucous retention cyst in the anterior left maxillary sinus. Remaining visualized sinuses and mastoid air cells are clear.  IMPRESSION: 1. Inflamed palatine tonsils with no evidence of a tonsillar or peritonsillar abscess. There is associated reactive adenopathy. 2. Widely patent airway. 3. 13 mm left thyroid lobe nodule.   Electronically Signed   By: Amie Portlandavid  Ormond M.D.   On: 02/08/2014 08:30    Microbiology: Recent Results (from the past 240 hour(s))  CULTURE, GROUP A STREP     Status: None   Collection Time    02/03/14  3:11 PM      Result Value Ref Range Status   Specimen Description THROAT   Final   Special Requests NONE   Final   Culture  Final   Value: No Beta Hemolytic Streptococci Isolated     Performed at [facility]   Report Status 02/05/2014 FINAL   Final  CULTURE, GROUP A STREP     Status: None   Collection Time    02/07/14  2:50 PM      Result Value Ref Range Status   Specimen Description THROAT   Final   Special Requests NONE   Final   Culture     Final   Value: No Beta Hemolytic Streptococci Isolated     Performed at [facility]   Report Status 02/09/2014 FINAL   Final  RAPID STREP SCREEN     Status: None   Collection Time    02/08/14  9:02 AM      Result Value Ref Range Status   Streptococcus, Group A Screen (Direct) NEGATIVE  NEGATIVE Final   Comment: (NOTE)     A Rapid Antigen test may result negative if the antigen level in the     sample is below the detection level of this test. The FDA has not     cleared this test as a stand-alone test therefore the rapid antigen     negative result has reflexed to a Group A Strep culture.  CULTURE, GROUP A STREP     Status: None   Collection Time    02/08/14  9:02 AM      Result Value Ref Range Status   Specimen Description  THROAT   Final   Special Requests NONE   Final   Culture     Final   Value: NO SUSPICIOUS COLONIES, CONTINUING TO HOLD     Performed at [facility]   Report Status PENDING   Incomplete     Labs: Basic Metabolic Panel:  Recent Labs Lab 02/07/14 2138 02/08/14 0714  NA 135* 138  K 4.1 4.7  CL 95* 103  CO2 22 21  GLUCOSE 93 150*  BUN 9 9  CREATININE 0.73 0.51  CALCIUM 8.6 9.1   Liver Function Tests:  Recent Labs Lab 02/08/14 0714  AST 23  ALT 19  ALKPHOS 101  BILITOT 0.4  PROT 7.4  ALBUMIN 3.4*   CBC:  Recent Labs Lab 02/07/14 1530 02/07/14 2138 02/08/14 0714 02/08/14 1020  WBC 11.2* 11.6* 8.2 8.1  NEUTROABS 8.6*  --   --   --   HGB 8.2* 7.2* 7.9* 7.7*  HCT 27.6* 24.5* 26.9* 26.2*  MCV 69.9* 70.2* 71.5* 70.4*  PLT 200 194 209 181    Signed:  [address]  [place], [place] [address] [place] [address]  [facility] 02/09/2014, 5:07 PM

## 2014-02-08 NOTE — Progress Notes (Signed)
Patient still refusing blood transfusion. Pt states she does not want the blood, but will receive the iron. MD aware. Patient currently asymptomatic of low hemoglobin.

## 2014-02-08 NOTE — Progress Notes (Addendum)
MEDICATION RELATED CONSULT NOTE - INITIAL   Pharmacy Consult for iron dextran Indication: anemia  No Known Allergies  Patient Measurements: Height: 5\' 2"  (157.5 cm) Weight: 117 lb 8 oz (53.298 kg) IBW/kg (Calculated) : 50.1  Vital Signs: Temp: 97.4 F (36.3 C) (06/17 1342) Temp src: Oral (06/17 1342) BP: 120/79 mmHg (06/17 1342) Pulse Rate: 61 (06/17 1342) Intake/Output from previous day: 06/16 0701 - 06/17 0700 In: 240 [P.O.:240] Out: -  Intake/Output from this shift: Total I/O In: 392 [P.O.:342; IV Piggyback:50] Out: -   Labs:  Recent Labs  02/07/14 2138 02/08/14 0714 02/08/14 1020  WBC 11.6* 8.2 8.1  HGB 7.2* 7.9* 7.7*  HCT 24.5* 26.9* 26.2*  PLT 194 209 181  CREATININE 0.73 0.51  --   ALBUMIN  --  3.4*  --   PROT  --  7.4  --   AST  --  23  --   ALT  --  19  --   ALKPHOS  --  101  --   BILITOT  --  0.4  --    Estimated Creatinine Clearance: 82.8 ml/min (by C-G formula based on Cr of 0.51).   Microbiology: Recent Results (from the past 720 hour(s))  CULTURE, GROUP A STREP     Status: None   Collection Time    02/03/14  3:11 PM      Result Value Ref Range Status   Specimen Description THROAT   Final   Special Requests NONE   Final   Culture     Final   Value: No Beta Hemolytic Streptococci Isolated     [facility]formed [facility]   Report Status 02/05/2014 FINAL   Final  CULTURE, GROUP A STREP     Status: None   Collection Time    02/07/14  2:50 PM      Result Value Ref Range Status   Specimen Description THROAT   Final   Special Requests NONE   Final   Culture     Final   Value: NO SUSPICIOUS COLONIES, CONTINUING TO HOLD     Performed at [facility]   Report Status PENDING   Incomplete  RAPID STREP SCREEN     Status: None   Collection Time    02/08/14  9:02 AM      Result Value Ref Range Status   Streptococcus, Group A Screen (Direct) NEGATIVE  NEGATIVE Final   Comment: (NOTE)     A Rapid Antigen test may result negative  if the antigen level in the     sample is below the detection level of this test. The FDA has not     cleared this test as a stand-alone test therefore the rapid antigen     negative result has reflexed to a Group A Strep culture.    Medical History: Past Medical History  Diagnosis Date  . Anemia    Assessment: Patient is a 28 y.o F with iron deficiency anemia.  Iron <10, ferritin 25.  To start iron dextran for supplementation.  Plan:  1) iron dextran 25mh test dose x1 2) if no reaction to test dose , then iron dextran 650mg  IV daily  x2 doses (total 1300mg ) 3) pharmacy will sign off  Lucia Gaskinsham, Onofrio Klemp P 02/08/2014,4:06 PM

## 2014-02-08 NOTE — Progress Notes (Signed)
Patient c/o of chest discomfort and tightness. VVS . Provided with 1 litrer of o2 to see if her discomfort improves. Will notify MD

## 2014-02-08 NOTE — Consult Note (Addendum)
Unassigned patient Reason for Consult: Anemia and guaiac positive stools. Referring Physician: THP.  Meghan Bennett is an 28 y.o. female.  HPI: 28 year old, black female admitted with a sore throat and fever, diagnosed with pharyngitis, found to be anemic with guaiac positive stools-concern for GI blood loss as her endometrium appears normal on an ultrasound done today. She claims her menstrual cycle usually lasts for 7 days with 3-4 days of heavy bleeding. She has not seen a gynecologist since she had her last baby about 3 years ago. There is no history of melena or hematochezia, diarrhea or constipation, abdominal pain, nausea, vomiting, dysphagia or odynophagia. There is no past history of ulcers, jaundice or colitis. She denies a family history of colon cancer. She admits using Goody powders off and on.  Past Medical History  Diagnosis Date  . Anemia    Past Surgical History  Procedure Laterality Date  . Tubal ligation  2011   Family History  Problem Relation Age of Onset  . CAD Other   . Cancer Other   . Diabetes Mellitus II Neg Hx   . Anemia Neg Hx    Social History:  reports that she has been smoking Cigarettes.  She has been smoking about 0.00 packs per day. She does not have any smokeless tobacco history on file. She reports that she drinks alcohol. She reports that she does not use illicit drugs.  Allergies: No Known Allergies  Medications: I have reviewed the patient's current medications.  Results for orders placed during the hospital encounter of 02/07/14 (from the past 48 hour(s))  CBC     Status: Abnormal   Collection Time    02/07/14  9:38 PM      Result Value Ref Range   WBC 11.6 (*) 4.0 - 10.5 K/uL   RBC 3.49 (*) 3.87 - 5.11 MIL/uL   Hemoglobin 7.2 (*) 12.0 - 15.0 g/dL   HCT 24.5 (*) 36.0 - 46.0 %   MCV 70.2 (*) 78.0 - 100.0 fL   MCH 20.6 (*) 26.0 - 34.0 pg   MCHC 29.4 (*) 30.0 - 36.0 g/dL   RDW 19.6 (*) 11.5 - 15.5 %   Platelets 194  150 - 400 K/uL  BASIC  METABOLIC PANEL     Status: Abnormal   Collection Time    02/07/14  9:38 PM      Result Value Ref Range   Sodium 135 (*) 137 - 147 mEq/L   Potassium 4.1  3.7 - 5.3 mEq/L   Chloride 95 (*) 96 - 112 mEq/L   CO2 22  19 - 32 mEq/L   Glucose, Bld 93  70 - 99 mg/dL   BUN 9  6 - 23 mg/dL   Creatinine, Ser 0.73  0.50 - 1.10 mg/dL   Calcium 8.6  8.4 - 10.5 mg/dL   GFR calc non Af Amer >90  >90 mL/min   GFR calc Af Amer >90  >90 mL/min   Comment: (NOTE)     The eGFR has been calculated using the CKD EPI equation.     This calculation has not been validated in all clinical situations.     eGFR's persistently <90 mL/min signify possible Chronic Kidney     Disease.  POC OCCULT BLOOD, ED     Status: Abnormal   Collection Time    02/07/14 11:30 PM      Result Value Ref Range   Fecal Occult Bld POSITIVE (*) NEGATIVE  VITAMIN B12  Status: None   Collection Time    02/07/14 11:59 PM      Result Value Ref Range   Vitamin B-12 587  211 - 911 pg/mL   Comment: Performed at [facility]     Status: None   Collection Time    02/07/14 11:59 PM      Result Value Ref Range   Folate 10.8     Comment: (NOTE)     Reference Ranges            Deficient:       0.4 - 3.3 ng/mL            Indeterminate:   3.4 - 5.4 ng/mL            Normal:              > 5.4 ng/mL     Performed at [facility]     Status: Abnormal   Collection Time    02/07/14 11:59 PM      Result Value Ref Range   Iron <10 (*) 42 - 135 ug/dL   TIBC Not calculated due to Iron <10.  250 - 470 ug/dL   Saturation Ratios Not calculated due to Iron <10.  20 - 55 %   UIBC 313  125 - 400 ug/dL   Comment: Performed at [facility]     Status: None   Collection Time    02/07/14 11:59 PM      Result Value Ref Range   Ferritin 25  10 - 291 ng/mL   Comment: Performed at [facility]     Status: Abnormal   Collection Time    02/07/14 11:59 PM      Result  Value Ref Range   Retic Ct Pct 0.6  0.4 - 3.1 %   RBC. 3.23 (*) 3.87 - 5.11 MIL/uL   Retic Count, Manual 19.4  19.0 - 186.0 K/uL  TYPE AND SCREEN     Status: None   Collection Time    02/07/14 11:59 PM      Result Value Ref Range   ABO/RH(D) A POS     Antibody Screen NEG     Sample Expiration 02/10/2014     Unit Number [id number]     Blood Component Type RED CELLS,LR     Unit division 00     Status of Unit ALLOCATED     Transfusion Status OK TO TRANSFUSE     Crossmatch Result Compatible    LACTATE DEHYDROGENASE     Status: None   Collection Time    02/07/14 11:59 PM      Result Value Ref Range   LDH 175  94 - 250 U/L  ABO/RH     Status: None   Collection Time    02/07/14 11:59 PM      Result Value Ref Range   ABO/RH(D) A POS    URINALYSIS, ROUTINE W REFLEX MICROSCOPIC     Status: Abnormal   Collection Time    02/08/14  1:09 AM      Result Value Ref Range   Color, Urine YELLOW  YELLOW   APPearance CLEAR  CLEAR   Specific Gravity, Urine 1.018  1.005 - 1.030   pH 6.0  5.0 - 8.0   Glucose, UA NEGATIVE  NEGATIVE mg/dL   Hgb urine dipstick MODERATE (*) NEGATIVE   Bilirubin Urine NEGATIVE  NEGATIVE  Ketones, ur >80 (*) NEGATIVE mg/dL   Protein, ur NEGATIVE  NEGATIVE mg/dL   Urobilinogen, UA 1.0  0.0 - 1.0 mg/dL   Nitrite NEGATIVE  NEGATIVE   Leukocytes, UA NEGATIVE  NEGATIVE  URINE MICROSCOPIC-ADD ON     Status: Abnormal   Collection Time    02/08/14  1:09 AM      Result Value Ref Range   Squamous Epithelial / LPF MANY (*) RARE   WBC, UA 0-2  <3 WBC/hpf   RBC / HPF 0-2  <3 RBC/hpf   Bacteria, UA RARE  RARE  PREGNANCY, URINE     Status: None   Collection Time    02/08/14  1:09 AM      Result Value Ref Range   Preg Test, Ur NEGATIVE  NEGATIVE   Comment:            THE SENSITIVITY OF THIS     METHODOLOGY IS >20 mIU/mL.  COMPREHENSIVE METABOLIC PANEL     Status: Abnormal   Collection Time    02/08/14  7:14 AM      Result Value Ref Range   Sodium 138  137 - 147  mEq/L   Potassium 4.7  3.7 - 5.3 mEq/L   Chloride 103  96 - 112 mEq/L   CO2 21  19 - 32 mEq/L   Glucose, Bld 150 (*) 70 - 99 mg/dL   BUN 9  6 - 23 mg/dL   Creatinine, Ser 0.51  0.50 - 1.10 mg/dL   Calcium 9.1  8.4 - 10.5 mg/dL   Total Protein 7.4  6.0 - 8.3 g/dL   Albumin 3.4 (*) 3.5 - 5.2 g/dL   AST 23  0 - 37 U/L   ALT 19  0 - 35 U/L   Alkaline Phosphatase 101  39 - 117 U/L   Total Bilirubin 0.4  0.3 - 1.2 mg/dL   GFR calc non Af Amer >90  >90 mL/min   GFR calc Af Amer >90  >90 mL/min   Comment: (NOTE)     The eGFR has been calculated using the CKD EPI equation.     This calculation has not been validated in all clinical situations.     eGFR's persistently <90 mL/min signify possible Chronic Kidney     Disease.  CBC     Status: Abnormal   Collection Time    02/08/14  7:14 AM      Result Value Ref Range   WBC 8.2  4.0 - 10.5 K/uL   RBC 3.76 (*) 3.87 - 5.11 MIL/uL   Hemoglobin 7.9 (*) 12.0 - 15.0 g/dL   Comment: REPEATED TO VERIFY   HCT 26.9 (*) 36.0 - 46.0 %   MCV 71.5 (*) 78.0 - 100.0 fL   MCH 21.0 (*) 26.0 - 34.0 pg   MCHC 29.4 (*) 30.0 - 36.0 g/dL   RDW 20.1 (*) 11.5 - 15.5 %   Platelets 209  150 - 400 K/uL   Comment: PLATELET COUNT CONFIRMED BY SMEAR  HIV ANTIBODY (ROUTINE TESTING)     Status: None   Collection Time    02/08/14  7:14 AM      Result Value Ref Range   HIV 1&2 Ab, 4th Generation NONREACTIVE  NONREACTIVE   Comment: (NOTE)     A NONREACTIVE HIV Ag/Ab result does not exclude HIV infection since     the time frame for seroconversion is variable. If acute HIV infection     is suspected, a  HIV-1 RNA Qualitative TMA test is recommended.     HIV-1/2 Antibody Diff         Not indicated.     HIV-1 RNA, Qual TMA           Not indicated.     PLEASE NOTE: This information has been disclosed to you from records     whose confidentiality may be protected by state law. If your state     requires such protection, then the state law prohibits you from making     any  further disclosure of the information without the specific written     consent of the person to whom it pertains, or as otherwise permitted     by law. A general authorization for the release of medical or other     information is NOT sufficient for this purpose.     The performance of this assay has not been clinically validated in     patients less than 65 years old.     Performed at [facility]     Status: None   Collection Time    02/08/14  9:02 AM      Result Value Ref Range   Streptococcus, Group A Screen (Direct) NEGATIVE  NEGATIVE   Comment: (NOTE)     A Rapid Antigen test may result negative if the antigen level in the     sample is below the detection level of this test. The FDA has not     cleared this test as a stand-alone test therefore the rapid antigen     negative result has reflexed to a Group A Strep culture.  CBC     Status: Abnormal   Collection Time    02/08/14 10:20 AM      Result Value Ref Range   WBC 8.1  4.0 - 10.5 K/uL   RBC 3.72 (*) 3.87 - 5.11 MIL/uL   Hemoglobin 7.7 (*) 12.0 - 15.0 g/dL   Comment: REPEATED TO VERIFY     CONSISTENT WITH PREVIOUS RESULT   HCT 26.2 (*) 36.0 - 46.0 %   MCV 70.4 (*) 78.0 - 100.0 fL   MCH 20.7 (*) 26.0 - 34.0 pg   MCHC 29.4 (*) 30.0 - 36.0 g/dL   RDW 19.8 (*) 11.5 - 15.5 %   Platelets 181  150 - 400 K/uL  PREPARE RBC (CROSSMATCH)     Status: None   Collection Time    02/08/14  1:20 PM      Result Value Ref Range   Order Confirmation ORDER PROCESSED BY BLOOD BANK     Dg Chest 2 View  02/07/2014   CLINICAL DATA:  Short of breath.  Nausea and vomiting.  EXAM: CHEST  2 VIEW  COMPARISON:  11/05/2005.  FINDINGS: The heart size and mediastinal contours are within normal limits. Both lungs are clear. The visualized skeletal structures are unremarkable.  IMPRESSION: No active cardiopulmonary disease.   Electronically Signed   By: Dereck Ligas M.D.   On: 02/07/2014 22:11   Ct Soft Tissue Neck W  Contrast  02/08/2014   CLINICAL DATA:  Sore throat right greater than left. Improving with IV antibiotics.  EXAM: CT NECK WITH CONTRAST  TECHNIQUE: Multidetector CT imaging of the neck was performed using the standard protocol following the bolus administration of intravenous contrast.  CONTRAST:  67m OMNIPAQUE IOHEXOL 300 MG/ML  SOLN  COMPARISON:  None.  FINDINGS: The palatine tonsils are enlarged and  heterogeneous attenuation, with the right slightly larger than the left. There is no formed fluid collection to suggest a tonsillar abscess. The parapharyngeal fat is well preserved bilaterally.  There is bilateral adenopathy along the jugular chains, the largest node 80 right level-II lymph node measuring 12 mm in short axis.  A 13 mm hypo attenuating lesion lies in the left thyroid lobe.  No other abnormalities. Lung apices are clear. Airway is widely patent. Visualized portions of the globes and orbits are unremarkable. There is a small focus of mucosal thickening versus a mucous retention cyst in the anterior left maxillary sinus. Remaining visualized sinuses and mastoid air cells are clear.  IMPRESSION: 1. Inflamed palatine tonsils with no evidence of a tonsillar or peritonsillar abscess. There is associated reactive adenopathy. 2. Widely patent airway. 3. 13 mm left thyroid lobe nodule.   Electronically Signed   By: Lajean Manes M.D.   On: 02/08/2014 08:30   US Transvaginal Non-ob  02/08/2014   CLINICAL DATA:  Menorrhagia.  EXAM: TRANSABDOMINAL AND TRANSVAGINAL ULTRASOUND OF PELVIS  TECHNIQUE: Both transabdominal and transvaginal ultrasound examinations of the pelvis were performed. Transabdominal technique was performed for global imaging of the pelvis including uterus, ovaries, adnexal regions, and pelvic cul-de-sac. It was necessary to proceed with endovaginal exam following the transabdominal exam to visualize the endometrium.  COMPARISON:  None  FINDINGS: Uterus  Measurements: 9.4 x 5.7 x 4.6 cm. No  fibroids or other mass visualized.  Endometrium  Thickness: 4.9 mm.  No focal abnormality visualized.  Right ovary  Measurements: 5.6 x 2.8 x 2.8 cm. Normal appearance/no adnexal mass.  Left ovary  Measurements: 3.7 x 3.3 x 2.1 cm. Normal appearance/no adnexal mass.  Other findings  Trace amount of free fluid is noted in the pelvis.  IMPRESSION: No significant abnormality seen in the pelvis.   Electronically Signed   By: Sabino Dick M.D.   On: 02/08/2014 10:50   US Pelvis Complete  02/08/2014   CLINICAL DATA:  Menorrhagia.  EXAM: TRANSABDOMINAL AND TRANSVAGINAL ULTRASOUND OF PELVIS  TECHNIQUE: Both transabdominal and transvaginal ultrasound examinations of the pelvis were performed. Transabdominal technique was performed for global imaging of the pelvis including uterus, ovaries, adnexal regions, and pelvic cul-de-sac. It was necessary to proceed with endovaginal exam following the transabdominal exam to visualize the endometrium.  COMPARISON:  None  FINDINGS: Uterus  Measurements: 9.4 x 5.7 x 4.6 cm. No fibroids or other mass visualized.  Endometrium  Thickness: 4.9 mm.  No focal abnormality visualized.  Right ovary  Measurements: 5.6 x 2.8 x 2.8 cm. Normal appearance/no adnexal mass.  Left ovary  Measurements: 3.7 x 3.3 x 2.1 cm. Normal appearance/no adnexal mass.  Other findings  Trace amount of free fluid is noted in the pelvis.  IMPRESSION: No significant abnormality seen in the pelvis.   Electronically Signed   By: Sabino Dick M.D.   On: 02/08/2014 10:50   Review of Systems  Constitutional: Positive for fever, chills, malaise/fatigue and diaphoresis. Negative for weight loss.  HENT: Negative.   Eyes: Negative.   Respiratory: Negative for cough, hemoptysis, sputum production, shortness of breath and wheezing.   Cardiovascular: Negative.   Gastrointestinal: Positive for blood in stool. Negative for heartburn, nausea, vomiting, abdominal pain, diarrhea, constipation and melena.  Genitourinary:  Negative.   Musculoskeletal: Negative.   Skin: Negative.   Neurological: Positive for weakness.  Endo/Heme/Allergies: Negative.    Blood pressure 120/79, pulse 61, temperature 97.4 F (36.3 C), temperature source Oral, resp. rate  20, height '5\' 2"'  (1.575 m), weight 53.298 kg (117 lb 8 oz), last menstrual period 02/07/2014, SpO2 100.00%. Physical Exam  Constitutional: Vital signs are normal. She appears lethargic. She is cooperative. No distress.  HENT:  Head: Normocephalic and atraumatic.  Eyes: Lids are normal.  Neck: Trachea normal and normal range of motion. Neck supple.  Cardiovascular: Normal rate and regular rhythm.   Respiratory: Effort normal and breath sounds normal.  GI: Soft. Normal appearance and bowel sounds are normal.  Musculoskeletal: Normal range of motion.  Neurological: She appears lethargic.  Skin: Skin is warm and dry.  Psychiatric: Her speech is normal and behavior is normal. Judgment and thought content normal. Cognition and memory are normal.   Assessment/Plan: 1) Acute pharyngitis without any abscess on antibiotics.  2) Severe iron deficiency anemia [iron level <10] with guaiac positive stool: will do an EGD prior to discharge. Agree with iron infusions tonight.  3) 13 mm left thyroid nodule- will need follow up.  Jesi Jurgens 02/08/2014, 5:31 PM

## 2014-02-08 NOTE — Progress Notes (Signed)
PROGRESS NOTE  Meghan Libmanundria D Howerter ZOX:[id number] DOB: February 14, 1986 DOA: 02/07/2014 PCP: No PCP Per Patient  HPI/Subjective: Meghan Bennett is a 28 y.o. female with 5 day history of sore throat with fever and nausea and vomiting. Was seen in ED on 6/12 and was given post-dated amoxicillin prescription for pharyngitis. 6/16 patient presented back to ED with worsening symptoms and right sided neck pain. Patient is also anemic and was told she was anemic three years ago. Patient states menorrhagia and using 3-5 pads per day for 7 days when on cycle.   Today, patient denies nausea, vomiting, diarrhea, joint pain, rash, difficulties swallowing, change in voice, and abdominal pain. States tired but feeling better. Patient also able to swallow better and does not have any throat/neck pain.    Hospital Course:  Principle problem  Exudative Pharyngitis  Suspect Strep Throat -even though Rapid strep negative, pending Strep culture  Continue with Ceftriaxone ,CT neck reveals inflamed palantine tonsils with no abscess. Improved, no odynphagia or drooling  Microcytic Anemia  -suspect long standing anemia-given hx of menorrhagia, however recently took Goody powder for pain, hemoccult positive as well. Pelvic/Transvaginal ultrasound neg for major abnormalities. Await GI eval, if endoscopic exam negative, she will need outpatient endometrial bx and eval by GYN -await Fe studies-if low-will supplement -will transfuse one unit  Chest Pain -suspect atypical-?GERD-try GI cocktail for now -EKG neg -given anemia-will transfuse one unit  Nausea with vomiting  Most likely due to pharyngitis  Strep culture pending  Resolved 6/17   Thyroid Nodule - 13mm left thyroid lobe nodule seen - needs to be worked up further as outpatient   DVT Prophylaxis:  Lovenox  Code Status: full Family Communication: none Disposition Plan: Inpatient   Consultants:  Gastroenterology    Procedures:  none  Antibiotics: Anti-infectives   Start     Dose/Rate Route Frequency Ordered Stop   02/08/14 1000  cefTRIAXone (ROCEPHIN) 1 g in dextrose 5 % 50 mL IVPB     1 g 100 mL/hr over 30 Minutes Intravenous Every 24 hours 02/08/14 0153        Objective: Filed Vitals:   02/08/14 0015 02/08/14 0038 02/08/14 0345 02/08/14 0500  BP: 113/59 113/74  105/68  Pulse: 98 100  97  Temp:  98.6 F (37 C) 97.5 F (36.4 C) 97.3 F (36.3 C)  TempSrc:  Oral Oral Oral  Resp:  17  17  Height:  5\' 2"  (1.575 m)    Weight:  53.298 kg (117 lb 8 oz)    SpO2: 100% 99%  100%    Intake/Output Summary (Last 24 hours) at 02/08/14 1241 Last data filed at 02/08/14 0900  Gross per 24 hour  Intake    512 ml  Output      0 ml  Net    512 ml   Filed Weights   02/07/14 1809 02/08/14 0038  Weight: 53.524 kg (118 lb) 53.298 kg (117 lb 8 oz)    Exam: General: Well developed, well nourished, seemingly lethargic laying in bed  Eyes: Pallor present no icterus.  ENT: 1+ Tonsillar hypertrophy, Exudates seen on the both tonsils. Pharynx erythematous  Neck: Supple, non-tender, mobile superior cervical lympadenopathy  Cardiovascular: RRR, no murmurs, rubs or gallops  Respiratory: Clear to auscultation without wheezes or crackles  Abdomen: Soft nontender, positive bowel sounds  Skin: No rash or edema .  Psychiatric: Appears normal.  Neurologic: Alert oriented to time place and person. Moves all extremities.    Data  Reviewed: Basic Metabolic Panel:  Recent Labs Lab 02/07/14 2138 02/08/14 0714  NA 135* 138  K 4.1 4.7  CL 95* 103  CO2 22 21  GLUCOSE 93 150*  BUN 9 9  CREATININE 0.73 0.51  CALCIUM 8.6 9.1   Liver Function Tests:  Recent Labs Lab 02/08/14 0714  AST 23  ALT 19  ALKPHOS 101  BILITOT 0.4  PROT 7.4  ALBUMIN 3.4*   No results found for this basename: LIPASE, AMYLASE,  in the last 168 hours No results found for this basename: AMMONIA,  in the last 168  hours CBC:  Recent Labs Lab 02/07/14 1530 02/07/14 2138 02/08/14 0714 02/08/14 1020  WBC 11.2* 11.6* 8.2 8.1  NEUTROABS 8.6*  --   --   --   HGB 8.2* 7.2* 7.9* 7.7*  HCT 27.6* 24.5* 26.9* 26.2*  MCV 69.9* 70.2* 71.5* 70.4*  PLT 200 194 209 181   Cardiac Enzymes: No results found for this basename: CKTOTAL, CKMB, CKMBINDEX, TROPONINI,  in the last 168 hours BNP (last 3 results) No results found for this basename: PROBNP,  in the last 8760 hours CBG: No results found for this basename: GLUCAP,  in the last 168 hours  Recent Results (from the past 240 hour(s))  CULTURE, GROUP A STREP     Status: None   Collection Time    02/03/14  3:11 PM      Result Value Ref Range Status   Specimen Description THROAT   Final   Special Requests NONE   Final   Culture     Final   Value: No Beta Hemolytic Streptococci Isolated     Performed at [facility]   Report Status 02/05/2014 FINAL   Final  CULTURE, GROUP A STREP     Status: None   Collection Time    02/07/14  2:50 PM      Result Value Ref Range Status   Specimen Description THROAT   Final   Special Requests NONE   Final   Culture     Final   Value: NO SUSPICIOUS COLONIES, CONTINUING TO HOLD     Performed at [facility]   Report Status PENDING   Incomplete  RAPID STREP SCREEN     Status: None   Collection Time    02/08/14  9:02 AM      Result Value Ref Range Status   Streptococcus, Group A Screen (Direct) NEGATIVE  NEGATIVE Final   Comment: (NOTE)     A Rapid Antigen test may result negative if the antigen level in the     sample is below the detection level of this test. The FDA has not     cleared this test as a stand-alone test therefore the rapid antigen     negative result has reflexed to a Group A Strep culture.     Studies: Dg Chest 2 View  02/07/2014   CLINICAL DATA:  Short of breath.  Nausea and vomiting.  EXAM: CHEST  2 VIEW  COMPARISON:  11/05/2005.  FINDINGS: The heart size and mediastinal  contours are within normal limits. Both lungs are clear. The visualized skeletal structures are unremarkable.  IMPRESSION: No active cardiopulmonary disease.   Electronically Signed   By: Andreas Newport M.D.   On: 02/07/2014 22:11   Ct Soft Tissue Neck W Contrast  02/08/2014   CLINICAL DATA:  Sore throat right greater than left. Improving with IV antibiotics.  EXAM: CT NECK WITH CONTRAST  TECHNIQUE: Multidetector CT imaging of the neck was performed using the standard protocol following the bolus administration of intravenous contrast.  CONTRAST:  75mL OMNIPAQUE IOHEXOL 300 MG/ML  SOLN  COMPARISON:  None.  FINDINGS: The palatine tonsils are enlarged and heterogeneous attenuation, with the right slightly larger than the left. There is no formed fluid collection to suggest a tonsillar abscess. The parapharyngeal fat is well preserved bilaterally.  There is bilateral adenopathy along the jugular chains, the largest node 80 right level-II lymph node measuring 12 mm in short axis.  A 13 mm hypo attenuating lesion lies in the left thyroid lobe.  No other abnormalities. Lung apices are clear. Airway is widely patent. Visualized portions of the globes and orbits are unremarkable. There is a small focus of mucosal thickening versus a mucous retention cyst in the anterior left maxillary sinus. Remaining visualized sinuses and mastoid air cells are clear.  IMPRESSION: 1. Inflamed palatine tonsils with no evidence of a tonsillar or peritonsillar abscess. There is associated reactive adenopathy. 2. Widely patent airway. 3. 13 mm left thyroid lobe nodule.   Electronically Signed   By: Amie Portland M.D.   On: 02/08/2014 08:30   US Transvaginal Non-ob  02/08/2014   CLINICAL DATA:  Menorrhagia.  EXAM: TRANSABDOMINAL AND TRANSVAGINAL ULTRASOUND OF PELVIS  TECHNIQUE: Both transabdominal and transvaginal ultrasound examinations of the pelvis were performed. Transabdominal technique was performed for global imaging of the pelvis  including uterus, ovaries, adnexal regions, and pelvic cul-de-sac. It was necessary to proceed with endovaginal exam following the transabdominal exam to visualize the endometrium.  COMPARISON:  None  FINDINGS: Uterus  Measurements: 9.4 x 5.7 x 4.6 cm. No fibroids or other mass visualized.  Endometrium  Thickness: 4.9 mm.  No focal abnormality visualized.  Right ovary  Measurements: 5.6 x 2.8 x 2.8 cm. Normal appearance/no adnexal mass.  Left ovary  Measurements: 3.7 x 3.3 x 2.1 cm. Normal appearance/no adnexal mass.  Other findings  Trace amount of free fluid is noted in the pelvis.  IMPRESSION: No significant abnormality seen in the pelvis.   Electronically Signed   By: Roque Lias M.D.   On: 02/08/2014 10:50   US Pelvis Complete  02/08/2014   CLINICAL DATA:  Menorrhagia.  EXAM: TRANSABDOMINAL AND TRANSVAGINAL ULTRASOUND OF PELVIS  TECHNIQUE: Both transabdominal and transvaginal ultrasound examinations of the pelvis were performed. Transabdominal technique was performed for global imaging of the pelvis including uterus, ovaries, adnexal regions, and pelvic cul-de-sac. It was necessary to proceed with endovaginal exam following the transabdominal exam to visualize the endometrium.  COMPARISON:  None  FINDINGS: Uterus  Measurements: 9.4 x 5.7 x 4.6 cm. No fibroids or other mass visualized.  Endometrium  Thickness: 4.9 mm.  No focal abnormality visualized.  Right ovary  Measurements: 5.6 x 2.8 x 2.8 cm. Normal appearance/no adnexal mass.  Left ovary  Measurements: 3.7 x 3.3 x 2.1 cm. Normal appearance/no adnexal mass.  Other findings  Trace amount of free fluid is noted in the pelvis.  IMPRESSION: No significant abnormality seen in the pelvis.   Electronically Signed   By: Roque Lias M.D.   On: 02/08/2014 10:50    Scheduled Meds: . cefTRIAXone (ROCEPHIN)  IV  1 g Intravenous Q24H  . pantoprazole (PROTONIX) IV  40 mg Intravenous Q12H   Continuous Infusions:   Principal Problem:   Pharyngitis Active  Problems:   Anemia   Nausea with vomiting   Menorrhagia      [facility] Pager [phone].  If 7PM-7AM, please contact night-coverage at [email or web address], password [email or web address] 02/08/2014, 12:41 PM  LOS: 1 day   Attending Patient was seen, examined,treatment plan was discussed with the Physician extender. I have directly reviewed the clinical findings, lab, imaging studies and management of this patient in detail. I have made the necessary changes to the above noted documentation, and agree with the documentation, as recorded by the Physician extender.  Windell NorfolkS Daley Mooradian MD [facility].

## 2014-02-09 ENCOUNTER — Encounter (HOSPITAL_COMMUNITY): Payer: Self-pay | Admitting: *Deleted

## 2014-02-09 ENCOUNTER — Encounter (HOSPITAL_COMMUNITY)
Admission: EM | Disposition: A | Discharge status: Home / Self Care | Payer: Self-pay | Source: Home / Self Care | Attending: Internal Medicine

## 2014-02-09 DIAGNOSIS — D500 Iron deficiency anemia secondary to blood loss (chronic): Secondary | ICD-10-CM

## 2014-02-09 DIAGNOSIS — N922 Excessive menstruation at puberty: Secondary | ICD-10-CM

## 2014-02-09 HISTORY — PX: ESOPHAGOGASTRODUODENOSCOPY: SHX5428

## 2014-02-09 LAB — ABO/RH: ABO/RH(D): A POS

## 2014-02-09 LAB — CULTURE, GROUP A STREP

## 2014-02-09 SURGERY — EGD (ESOPHAGOGASTRODUODENOSCOPY)
Anesthesia: Moderate Sedation | Laterality: Left

## 2014-02-09 MED ORDER — CLINDAMYCIN HCL 300 MG PO CAPS: 300.0000 mg | ORAL_CAPSULE | Freq: Three times a day (TID) | ORAL | Status: DC

## 2014-02-09 MED ORDER — MIDAZOLAM HCL 5 MG/ML IJ SOLN
INTRAMUSCULAR | Status: AC
  Filled 2014-02-09: qty 2

## 2014-02-09 MED ORDER — BUTAMBEN-TETRACAINE-BENZOCAINE 2-2-14 % EX AERO
INHALATION_SPRAY | CUTANEOUS | Status: DC | PRN
  Administered 2014-02-09: 1 via TOPICAL

## 2014-02-09 MED ORDER — DIPHENHYDRAMINE HCL 50 MG/ML IJ SOLN
INTRAMUSCULAR | Status: AC
  Filled 2014-02-09: qty 1

## 2014-02-09 MED ORDER — PANTOPRAZOLE SODIUM 40 MG PO TBEC
40.0000 mg | DELAYED_RELEASE_TABLET | Freq: Two times a day (BID) | ORAL | Status: DC
  Administered 2014-02-09: 40 mg via ORAL
  Filled 2014-02-09: qty 1

## 2014-02-09 MED ORDER — OXYCODONE-ACETAMINOPHEN 5-325 MG PO TABS
1.0000 | ORAL_TABLET | ORAL | Status: DC | PRN
  Administered 2014-02-09: 1 via ORAL
  Filled 2014-02-09: qty 1

## 2014-02-09 MED ORDER — RISAQUAD PO CAPS: 2.0000 | ORAL_CAPSULE | Freq: Every day | ORAL | Status: DC

## 2014-02-09 MED ORDER — OMEPRAZOLE 40 MG PO CPDR: 40.0000 mg | DELAYED_RELEASE_CAPSULE | Freq: Every day | ORAL | Status: DC

## 2014-02-09 MED ORDER — MIDAZOLAM HCL 10 MG/2ML IJ SOLN
INTRAMUSCULAR | Status: DC | PRN
  Administered 2014-02-09 (×5): 2 mg via INTRAVENOUS

## 2014-02-09 MED ORDER — FENTANYL CITRATE 0.05 MG/ML IJ SOLN
INTRAMUSCULAR | Status: AC
Start: 2014-02-09 — End: 2014-02-09
  Filled 2014-02-09: qty 2

## 2014-02-09 MED ORDER — FERROUS SULFATE 325 (65 FE) MG PO TABS: 325.0000 mg | ORAL_TABLET | Freq: Two times a day (BID) | ORAL | Status: DC

## 2014-02-09 MED ORDER — FENTANYL CITRATE 0.05 MG/ML IJ SOLN
INTRAMUSCULAR | Status: DC | PRN
  Administered 2014-02-09 (×4): 25 ug via INTRAVENOUS

## 2014-02-09 NOTE — Interval H&P Note (Signed)
History and Physical Interval Note:  02/09/2014 2:57 PM  Meghan LibmanAundria D Bloyd  has presented today for surgery, with the diagnosis of Anemia-IDA and guaiac positive stools  The various methods of treatment have been discussed with the patient and family. After consideration of risks, benefits and other options for treatment, the patient has consented to  Procedure(s): ESOPHAGOGASTRODUODENOSCOPY (EGD) (Left) as a surgical intervention .  The patient's history has been reviewed, patient examined, no change in status, stable for surgery.  I have reviewed the patient's chart and labs.  Questions were answered to the patient's satisfaction.     HUNG,PATRICK D

## 2014-02-09 NOTE — Discharge Instructions (Signed)
Your upper endoscopy showed Gastritis from Goody powders.  No more goody powders, Take tylenol for pain.     Follow up with your dentist for the tooth pain

## 2014-02-09 NOTE — H&P (View-Only) (Signed)
Unassigned patient Reason for Consult: Anemia and guaiac positive stools. Referring Physician: THP.  Meghan Bennett is an 28 y.o. female.  HPI: 28 year old, black female admitted with a sore throat and fever, diagnosed with pharyngitis, found to be anemic with guaiac positive stools-concern for GI blood loss as her endometrium appears normal on an ultrasound done today. She claims her menstrual cycle usually lasts for 7 days with 3-4 days of heavy bleeding. She has not seen a gynecologist since she had her last baby about 3 years ago. There is no history of melena or hematochezia, diarrhea or constipation, abdominal pain, nausea, vomiting, dysphagia or odynophagia. There is no past history of ulcers, jaundice or colitis. She denies a family history of colon cancer. She admits using Goody powders off and on.  Past Medical History  Diagnosis Date  . Anemia    Past Surgical History  Procedure Laterality Date  . Tubal ligation  2011   Family History  Problem Relation Age of Onset  . CAD Other   . Cancer Other   . Diabetes Mellitus II Neg Hx   . Anemia Neg Hx    Social History:  reports that she has been smoking Cigarettes.  She has been smoking about 0.00 packs per day. She does not have any smokeless tobacco history on file. She reports that she drinks alcohol. She reports that she does not use illicit drugs.  Allergies: No Known Allergies  Medications: I have reviewed the patient's current medications.  Results for orders placed during the hospital encounter of 02/07/14 (from the past 48 hour(s))  CBC     Status: Abnormal   Collection Time    02/07/14  9:38 PM      Result Value Ref Range   WBC 11.6 (*) 4.0 - 10.5 K/uL   RBC 3.49 (*) 3.87 - 5.11 MIL/uL   Hemoglobin 7.2 (*) 12.0 - 15.0 g/dL   HCT 24.5 (*) 36.0 - 46.0 %   MCV 70.2 (*) 78.0 - 100.0 fL   MCH 20.6 (*) 26.0 - 34.0 pg   MCHC 29.4 (*) 30.0 - 36.0 g/dL   RDW 19.6 (*) 11.5 - 15.5 %   Platelets 194  150 - 400 K/uL  BASIC  METABOLIC PANEL     Status: Abnormal   Collection Time    02/07/14  9:38 PM      Result Value Ref Range   Sodium 135 (*) 137 - 147 mEq/L   Potassium 4.1  3.7 - 5.3 mEq/L   Chloride 95 (*) 96 - 112 mEq/L   CO2 22  19 - 32 mEq/L   Glucose, Bld 93  70 - 99 mg/dL   BUN 9  6 - 23 mg/dL   Creatinine, Ser 0.73  0.50 - 1.10 mg/dL   Calcium 8.6  8.4 - 10.5 mg/dL   GFR calc non Af Amer >90  >90 mL/min   GFR calc Af Amer >90  >90 mL/min   Comment: (NOTE)     The eGFR has been calculated using the CKD EPI equation.     This calculation has not been validated in all clinical situations.     eGFR's persistently <90 mL/min signify possible Chronic Kidney     Disease.  POC OCCULT BLOOD, ED     Status: Abnormal   Collection Time    02/07/14 11:30 PM      Result Value Ref Range   Fecal Occult Bld POSITIVE (*) NEGATIVE  VITAMIN B12  Status: None   Collection Time    02/07/14 11:59 PM      Result Value Ref Range   Vitamin B-12 587  211 - 911 pg/mL   Comment: Performed at [facility]     Status: None   Collection Time    02/07/14 11:59 PM      Result Value Ref Range   Folate 10.8     Comment: (NOTE)     Reference Ranges            Deficient:       0.4 - 3.3 ng/mL            Indeterminate:   3.4 - 5.4 ng/mL            Normal:              > 5.4 ng/mL     Performed at [facility] TIBC     Status: Abnormal   Collection Time    02/07/14 11:59 PM      Result Value Ref Range   Iron <10 (*) 42 - 135 ug/dL   TIBC Not calculated due to Iron <10.  250 - 470 ug/dL   Saturation Ratios Not calculated due to Iron <10.  20 - 55 %   UIBC 313  125 - 400 ug/dL   Comment: Performed at [facility]     Status: None   Collection Time    02/07/14 11:59 PM      Result Value Ref Range   Ferritin 25  10 - 291 ng/mL   Comment: Performed at [facility]     Status: Abnormal   Collection Time    02/07/14 11:59 PM      Result  Value Ref Range   Retic Ct Pct 0.6  0.4 - 3.1 %   RBC. 3.23 (*) 3.87 - 5.11 MIL/uL   Retic Count, Manual 19.4  19.0 - 186.0 K/uL  TYPE AND SCREEN     Status: None   Collection Time    02/07/14 11:59 PM      Result Value Ref Range   ABO/RH(D) A POS     Antibody Screen NEG     Sample Expiration 02/10/2014     Unit Number [id number]     Blood Component Type RED CELLS,LR     Unit division 00     Status of Unit ALLOCATED     Transfusion Status OK TO TRANSFUSE     Crossmatch Result Compatible    LACTATE DEHYDROGENASE     Status: None   Collection Time    02/07/14 11:59 PM      Result Value Ref Range   LDH 175  94 - 250 U/L  ABO/RH     Status: None   Collection Time    02/07/14 11:59 PM      Result Value Ref Range   ABO/RH(D) A POS    URINALYSIS, ROUTINE W REFLEX MICROSCOPIC     Status: Abnormal   Collection Time    02/08/14  1:09 AM      Result Value Ref Range   Color, Urine YELLOW  YELLOW   APPearance CLEAR  CLEAR   Specific Gravity, Urine 1.018  1.005 - 1.030   pH 6.0  5.0 - 8.0   Glucose, UA NEGATIVE  NEGATIVE mg/dL   Hgb urine dipstick MODERATE (*) NEGATIVE   Bilirubin Urine NEGATIVE  NEGATIVE  Ketones, ur >80 (*) NEGATIVE mg/dL   Protein, ur NEGATIVE  NEGATIVE mg/dL   Urobilinogen, UA 1.0  0.0 - 1.0 mg/dL   Nitrite NEGATIVE  NEGATIVE   Leukocytes, UA NEGATIVE  NEGATIVE  URINE MICROSCOPIC-ADD ON     Status: Abnormal   Collection Time    02/08/14  1:09 AM      Result Value Ref Range   Squamous Epithelial / LPF MANY (*) RARE   WBC, UA 0-2  <3 WBC/hpf   RBC / HPF 0-2  <3 RBC/hpf   Bacteria, UA RARE  RARE  PREGNANCY, URINE     Status: None   Collection Time    02/08/14  1:09 AM      Result Value Ref Range   Preg Test, Ur NEGATIVE  NEGATIVE   Comment:            THE SENSITIVITY OF THIS     METHODOLOGY IS >20 mIU/mL.  COMPREHENSIVE METABOLIC PANEL     Status: Abnormal   Collection Time    02/08/14  7:14 AM      Result Value Ref Range   Sodium 138  137 - 147  mEq/L   Potassium 4.7  3.7 - 5.3 mEq/L   Chloride 103  96 - 112 mEq/L   CO2 21  19 - 32 mEq/L   Glucose, Bld 150 (*) 70 - 99 mg/dL   BUN 9  6 - 23 mg/dL   Creatinine, Ser 0.51  0.50 - 1.10 mg/dL   Calcium 9.1  8.4 - 10.5 mg/dL   Total Protein 7.4  6.0 - 8.3 g/dL   Albumin 3.4 (*) 3.5 - 5.2 g/dL   AST 23  0 - 37 U/L   ALT 19  0 - 35 U/L   Alkaline Phosphatase 101  39 - 117 U/L   Total Bilirubin 0.4  0.3 - 1.2 mg/dL   GFR calc non Af Amer >90  >90 mL/min   GFR calc Af Amer >90  >90 mL/min   Comment: (NOTE)     The eGFR has been calculated using the CKD EPI equation.     This calculation has not been validated in all clinical situations.     eGFR's persistently <90 mL/min signify possible Chronic Kidney     Disease.  CBC     Status: Abnormal   Collection Time    02/08/14  7:14 AM      Result Value Ref Range   WBC 8.2  4.0 - 10.5 K/uL   RBC 3.76 (*) 3.87 - 5.11 MIL/uL   Hemoglobin 7.9 (*) 12.0 - 15.0 g/dL   Comment: REPEATED TO VERIFY   HCT 26.9 (*) 36.0 - 46.0 %   MCV 71.5 (*) 78.0 - 100.0 fL   MCH 21.0 (*) 26.0 - 34.0 pg   MCHC 29.4 (*) 30.0 - 36.0 g/dL   RDW 20.1 (*) 11.5 - 15.5 %   Platelets 209  150 - 400 K/uL   Comment: PLATELET COUNT CONFIRMED BY SMEAR  HIV ANTIBODY (ROUTINE TESTING)     Status: None   Collection Time    02/08/14  7:14 AM      Result Value Ref Range   HIV 1&2 Ab, 4th Generation NONREACTIVE  NONREACTIVE   Comment: (NOTE)     A NONREACTIVE HIV Ag/Ab result does not exclude HIV infection since     the time frame for seroconversion is variable. If acute HIV infection     is suspected, a  HIV-1 RNA Qualitative TMA test is recommended.     HIV-1/2 Antibody Diff         Not indicated.     HIV-1 RNA, Qual TMA           Not indicated.     PLEASE NOTE: This information has been disclosed to you from records     whose confidentiality may be protected by state law. If your state     requires such protection, then the state law prohibits you from making     any  further disclosure of the information without the specific written     consent of the person to whom it pertains, or as otherwise permitted     by law. A general authorization for the release of medical or other     information is NOT sufficient for this purpose.     The performance of this assay has not been clinically validated in     patients less than 23 years old.     Performed at [facility]     Status: None   Collection Time    02/08/14  9:02 AM      Result Value Ref Range   Streptococcus, Group A Screen (Direct) NEGATIVE  NEGATIVE   Comment: (NOTE)     A Rapid Antigen test may result negative if the antigen level in the     sample is below the detection level of this test. The FDA has not     cleared this test as a stand-alone test therefore the rapid antigen     negative result has reflexed to a Group A Strep culture.  CBC     Status: Abnormal   Collection Time    02/08/14 10:20 AM      Result Value Ref Range   WBC 8.1  4.0 - 10.5 K/uL   RBC 3.72 (*) 3.87 - 5.11 MIL/uL   Hemoglobin 7.7 (*) 12.0 - 15.0 g/dL   Comment: REPEATED TO VERIFY     CONSISTENT WITH PREVIOUS RESULT   HCT 26.2 (*) 36.0 - 46.0 %   MCV 70.4 (*) 78.0 - 100.0 fL   MCH 20.7 (*) 26.0 - 34.0 pg   MCHC 29.4 (*) 30.0 - 36.0 g/dL   RDW 19.8 (*) 11.5 - 15.5 %   Platelets 181  150 - 400 K/uL  PREPARE RBC (CROSSMATCH)     Status: None   Collection Time    02/08/14  1:20 PM      Result Value Ref Range   Order Confirmation ORDER PROCESSED BY BLOOD BANK     Dg Chest 2 View  02/07/2014   CLINICAL DATA:  Short of breath.  Nausea and vomiting.  EXAM: CHEST  2 VIEW  COMPARISON:  11/05/2005.  FINDINGS: The heart size and mediastinal contours are within normal limits. Both lungs are clear. The visualized skeletal structures are unremarkable.  IMPRESSION: No active cardiopulmonary disease.   Electronically Signed   By: Dereck Ligas M.D.   On: 02/07/2014 22:11   Ct Soft Tissue Neck W  Contrast  02/08/2014   CLINICAL DATA:  Sore throat right greater than left. Improving with IV antibiotics.  EXAM: CT NECK WITH CONTRAST  TECHNIQUE: Multidetector CT imaging of the neck was performed using the standard protocol following the bolus administration of intravenous contrast.  CONTRAST:  72m OMNIPAQUE IOHEXOL 300 MG/ML  SOLN  COMPARISON:  None.  FINDINGS: The palatine tonsils are enlarged and  heterogeneous attenuation, with the right slightly larger than the left. There is no formed fluid collection to suggest a tonsillar abscess. The parapharyngeal fat is well preserved bilaterally.  There is bilateral adenopathy along the jugular chains, the largest node 80 right level-II lymph node measuring 12 mm in short axis.  A 13 mm hypo attenuating lesion lies in the left thyroid lobe.  No other abnormalities. Lung apices are clear. Airway is widely patent. Visualized portions of the globes and orbits are unremarkable. There is a small focus of mucosal thickening versus a mucous retention cyst in the anterior left maxillary sinus. Remaining visualized sinuses and mastoid air cells are clear.  IMPRESSION: 1. Inflamed palatine tonsils with no evidence of a tonsillar or peritonsillar abscess. There is associated reactive adenopathy. 2. Widely patent airway. 3. 13 mm left thyroid lobe nodule.   Electronically Signed   By: Lajean Manes M.D.   On: 02/08/2014 08:30   US Transvaginal Non-ob  02/08/2014   CLINICAL DATA:  Menorrhagia.  EXAM: TRANSABDOMINAL AND TRANSVAGINAL ULTRASOUND OF PELVIS  TECHNIQUE: Both transabdominal and transvaginal ultrasound examinations of the pelvis were performed. Transabdominal technique was performed for global imaging of the pelvis including uterus, ovaries, adnexal regions, and pelvic cul-de-sac. It was necessary to proceed with endovaginal exam following the transabdominal exam to visualize the endometrium.  COMPARISON:  None  FINDINGS: Uterus  Measurements: 9.4 x 5.7 x 4.6 cm. No  fibroids or other mass visualized.  Endometrium  Thickness: 4.9 mm.  No focal abnormality visualized.  Right ovary  Measurements: 5.6 x 2.8 x 2.8 cm. Normal appearance/no adnexal mass.  Left ovary  Measurements: 3.7 x 3.3 x 2.1 cm. Normal appearance/no adnexal mass.  Other findings  Trace amount of free fluid is noted in the pelvis.  IMPRESSION: No significant abnormality seen in the pelvis.   Electronically Signed   By: Sabino Dick M.D.   On: 02/08/2014 10:50   US Pelvis Complete  02/08/2014   CLINICAL DATA:  Menorrhagia.  EXAM: TRANSABDOMINAL AND TRANSVAGINAL ULTRASOUND OF PELVIS  TECHNIQUE: Both transabdominal and transvaginal ultrasound examinations of the pelvis were performed. Transabdominal technique was performed for global imaging of the pelvis including uterus, ovaries, adnexal regions, and pelvic cul-de-sac. It was necessary to proceed with endovaginal exam following the transabdominal exam to visualize the endometrium.  COMPARISON:  None  FINDINGS: Uterus  Measurements: 9.4 x 5.7 x 4.6 cm. No fibroids or other mass visualized.  Endometrium  Thickness: 4.9 mm.  No focal abnormality visualized.  Right ovary  Measurements: 5.6 x 2.8 x 2.8 cm. Normal appearance/no adnexal mass.  Left ovary  Measurements: 3.7 x 3.3 x 2.1 cm. Normal appearance/no adnexal mass.  Other findings  Trace amount of free fluid is noted in the pelvis.  IMPRESSION: No significant abnormality seen in the pelvis.   Electronically Signed   By: Sabino Dick M.D.   On: 02/08/2014 10:50   Review of Systems  Constitutional: Positive for fever, chills, malaise/fatigue and diaphoresis. Negative for weight loss.  HENT: Negative.   Eyes: Negative.   Respiratory: Negative for cough, hemoptysis, sputum production, shortness of breath and wheezing.   Cardiovascular: Negative.   Gastrointestinal: Positive for blood in stool. Negative for heartburn, nausea, vomiting, abdominal pain, diarrhea, constipation and melena.  Genitourinary:  Negative.   Musculoskeletal: Negative.   Skin: Negative.   Neurological: Positive for weakness.  Endo/Heme/Allergies: Negative.    Blood pressure 120/79, pulse 61, temperature 97.4 F (36.3 C), temperature source Oral, resp. rate  20, height '5\' 2"'  (1.575 m), weight 53.298 kg (117 lb 8 oz), last menstrual period 02/07/2014, SpO2 100.00%. Physical Exam  Constitutional: Vital signs are normal. She appears lethargic. She is cooperative. No distress.  HENT:  Head: Normocephalic and atraumatic.  Eyes: Lids are normal.  Neck: Trachea normal and normal range of motion. Neck supple.  Cardiovascular: Normal rate and regular rhythm.   Respiratory: Effort normal and breath sounds normal.  GI: Soft. Normal appearance and bowel sounds are normal.  Musculoskeletal: Normal range of motion.  Neurological: She appears lethargic.  Skin: Skin is warm and dry.  Psychiatric: Her speech is normal and behavior is normal. Judgment and thought content normal. Cognition and memory are normal.   Assessment/Plan: 1) Acute pharyngitis without any abscess on antibiotics.  2) Severe iron deficiency anemia [iron level <10] with guaiac positive stool: will do an EGD prior to discharge. Agree with iron infusions tonight.  3) 13 mm left thyroid nodule- will need follow up.  MANN,JYOTHI 02/08/2014, 5:31 PM

## 2014-02-09 NOTE — Op Note (Signed)
[facility] [address] [place] [id number]   OPERATIVE PROCEDURE REPORT  PATIENT: Meghan Bennett, Meghan D.  MR#: [id number] BIRTHDATE: 05-Dec-1985  GENDER: Female ENDOSCOPIST: Jeani HawkingPatrick Hung, MD ASSISTANT:   Morene Antueginald Aldea Avis, and Dwain SarnaPatricia Ford, RN CGRN PROCEDURE DATE: 02/09/2014 PROCEDURE:   EGD w/ biopsy ASA CLASS:   Class II INDICATIONS:Melena, Heme positive stoo, Anemia. MEDICATIONS: Versed 10 mg IV and Fentanyl 100 mcg IV TOPICAL ANESTHETIC:  DESCRIPTION OF PROCEDURE:   After the risks benefits and alternatives of the procedure were thoroughly explained, informed consent was obtained.  The Pentax [id number] 3.8 [id number]  endoscope was introduced through the mouth  and advanced to the third portion of the duodenum Without limitations.      The instrument was slowly withdrawn as the mucosa was fully examined.      FINDINGS: The esohpagus was normal.  In the gastric body there was a diffuse, nonbleeding, moderate gastritis.  Cold bsiopsies were obtained.  No evidence of any ulcerations, erosions, or vascular abnormalities in the gastric lumen or duodenum.   Retroflexed views revealed no abnormalities.     The scope was then withdrawn from the patient and the procedure terminated.  COMPLICATIONS: There were no complications.  IMPRESSION: 1) Gastritis.  RECOMMENDATIONS: 1) Await biopsy results. 2) Gynecology evaluation for heavy menses. 3) Signing off.  _______________________________ Rosalie DoctoreSignedJeani Hawking:  Patrick Hung, MD 02/09/2014 3:39 PM

## 2014-02-09 NOTE — Care Management Note (Addendum)
    Page 1 of 1   02/10/2014     11:40:54 AM CARE MANAGEMENT NOTE 02/10/2014  Patient:  Meghan Bennett,Meghan Bennett   Account Number:  [id number]  Date Initiated:  02/09/2014  Documentation initiated by:  Letha CapeAYLOR,DEBORAH  Subjective/Objective Assessment:   dx swollen tonsiils, anemia  admit- from home.     Action/Plan:   Anticipated DC Date:  02/09/2014   Anticipated DC Plan:  HOME/SELF CARE      DC Planning Services  CM consult  [facility]      Choice offered to / List presented to:             Status of service:  Completed, signed off Medicare Important Message given?   (If response is "NO", the following Medicare IM given date fields will be blank) Date Medicare IM given:   Date Additional Medicare IM given:    Discharge Disposition:  HOME/SELF CARE  Per UR Regulation:  Reviewed for med. necessity/level of care/duration of stay  If discussed at Long Length of Stay Meetings, dates discussed:    Comments:  02/10/14 1140 Letha Capeeborah Taylor RN,BSN [phone] patient for dc today.  02/09/14 1649 Letha Capeeborah Taylor RN, BSN [phone] patient is for dc today, [facility], clida 53.00 and other med was 12.00 patient states she will need ast, NCM ast her with Match letter. NCM gave scripts to patient with letter.

## 2014-02-10 ENCOUNTER — Encounter (HOSPITAL_COMMUNITY): Payer: Self-pay | Admitting: Gastroenterology

## 2014-02-10 LAB — CULTURE, GROUP A STREP

## 2014-02-10 NOTE — Progress Notes (Signed)
Verbally understood DC instructions, no questions ask. 

## 2014-02-11 LAB — TYPE AND SCREEN
ABO/RH(D): A POS
Antibody Screen: NEGATIVE
UNIT DIVISION: 0

## 2014-03-01 ENCOUNTER — Ambulatory Visit: Payer: Self-pay

## 2014-04-03 ENCOUNTER — Encounter: Payer: Self-pay | Admitting: Internal Medicine

## 2014-04-03 ENCOUNTER — Ambulatory Visit (INDEPENDENT_AMBULATORY_CARE_PROVIDER_SITE_OTHER): Payer: Medicaid Other | Admitting: Internal Medicine

## 2014-04-03 VITALS — BP 114/76 | HR 96 | Temp 98.1°F | Resp 0 | Ht 62.0 in | Wt 123.0 lb

## 2014-04-03 DIAGNOSIS — E041 Nontoxic single thyroid nodule: Secondary | ICD-10-CM | POA: Insufficient documentation

## 2014-04-03 DIAGNOSIS — R5381 Other malaise: Secondary | ICD-10-CM | POA: Insufficient documentation

## 2014-04-03 DIAGNOSIS — D509 Iron deficiency anemia, unspecified: Secondary | ICD-10-CM | POA: Diagnosis not present

## 2014-04-03 DIAGNOSIS — R634 Abnormal weight loss: Secondary | ICD-10-CM | POA: Diagnosis not present

## 2014-04-03 DIAGNOSIS — R739 Hyperglycemia, unspecified: Secondary | ICD-10-CM

## 2014-04-03 DIAGNOSIS — K2960 Other gastritis without bleeding: Secondary | ICD-10-CM | POA: Diagnosis not present

## 2014-04-03 DIAGNOSIS — R7309 Other abnormal glucose: Secondary | ICD-10-CM

## 2014-04-03 DIAGNOSIS — R5383 Other fatigue: Secondary | ICD-10-CM

## 2014-04-03 MED ORDER — OMEPRAZOLE 40 MG PO CPDR
40.0000 mg | DELAYED_RELEASE_CAPSULE | Freq: Every day | ORAL | Status: DC
Start: 2014-04-03 — End: 2015-11-03

## 2014-04-03 NOTE — Progress Notes (Signed)
Patient ID: Meghan Bennett, female   DOB: 03-25-1986, 28 y.o.   MRN: [id number]   Meghan Bennett, is a 28 y.o. female  WUJ:[id number]  [id number]  DOB - February 17, 1986  CC:  Chief Complaint  Patient presents with  . Establish Care       HPI: Meghan Bennett is a 28 y.o. female here today to establish medical care. She was recently hospitalized for strept throat infection with suspiscion of Pharyngeal abscess. However it turned out to be just an aggressive Pharyngitis. At that time she was noted to be mildly anemic and guiac positive. She was evaluated by GI and found to have H. Pylori negative gastritis. Pt has completed a 30 day course of Omeprazole. Pt was also placed on oral iron supplementation and reports that she has been taking it inconsistently.  Pt today complains of continued fatigue and unintended weight loss since 2011. She denies hair loss, diarrhea or heat intolerance. During the last hospitalization, a 13 mm thyroid Nodule was surreptitiously found.    Patient has No headache, No chest pain, No abdominal pain - No Nausea, No new weakness tingling or numbness, No Cough - SOB.  No Known Allergies Past Medical History  Diagnosis Date  . Anemia    Current Outpatient Prescriptions on File Prior to Visit  Medication Sig Dispense Refill  . ferrous sulfate 325 (65 FE) MG tablet Take 1 tablet (325 mg total) by mouth 2 (two) times daily with a meal.  60 tablet  0   No current facility-administered medications on file prior to visit.   Family History  Problem Relation Age of Onset  . CAD Other   . Cancer Other   . Diabetes Mellitus II Neg Hx   . Anemia Neg Hx    History   Social History  . Marital Status: Married    Spouse Name: N/A    Number of Children: N/A  . Years of Education: N/A   Occupational History  . Not on file.   Social History Main Topics  . Smoking status: Light Tobacco Smoker    Types: Cigarettes  . Smokeless tobacco: Not on file  . Alcohol Use: Yes      Comment: occasional  . Drug Use: No  . Sexual Activity: Yes    Birth Control/ Protection: Surgical   Other Topics Concern  . Not on file   Social History Narrative  . No narrative on file    Review of Systems: Constitutional: Negative for fever, chills, diaphoresis, activity change, appetite change.Marland Kitchen HENT: Negative for ear pain, nosebleeds, congestion, facial swelling, rhinorrhea, neck pain, neck stiffness and ear discharge.  Eyes: Negative for pain, discharge, redness, itching and visual disturbance. Respiratory: Negative for cough, choking, chest tightness, shortness of breath, wheezing and stridor.  Cardiovascular: Negative for chest pain, palpitations and leg swelling. Gastrointestinal: Negative for abdominal distention. Genitourinary: Negative for dysuria, urgency, frequency, hematuria, flank pain, decreased urine volume, difficulty urinating and dyspareunia.  Musculoskeletal: Negative for back pain, joint swelling, arthralgia and gait problem. Neurological: Negative for dizziness, tremors, seizures, syncope, facial asymmetry, speech difficulty, weakness, light-headedness, numbness and headaches.  Hematological: Negative for adenopathy. Does not bruise/bleed easily. Psychiatric/Behavioral: Negative for hallucinations, behavioral problems, confusion, dysphoric mood, decreased concentration and agitation.    Objective:    Filed Vitals:   04/03/14 1443  BP: 114/76  Pulse: 96  Temp: 98.1 F (36.7 C)  Resp: 0    Physical Exam: Constitutional: Patient appears well-developed and well-nourished. No distress. HENT: Normocephalic, atraumatic,  External right and left ear normal. Oropharynx is clear and moist. No thyroid masses felt on palpation. Eyes: Conjunctivae and EOM are normal. PERRLA, no scleral icterus. Neck: Normal ROM. Neck supple. No JVD. No tracheal deviation. No thyromegaly. CVS: RRR, S1/S2 +, no murmurs, no gallops, no carotid bruit.  Pulmonary: Effort and  breath sounds normal, no stridor, rhonchi, wheezes, rales.  Abdominal: Soft. BS +, no distension, tenderness, rebound or guarding.  Musculoskeletal: Normal range of motion. No edema and no tenderness.  Lymphadenopathy: No lymphadenopathy noted, cervical, inguinal or axillary Neuro: Alert. Normal reflexes, muscle tone coordination. No cranial nerve deficit. Skin: Skin is warm and dry. No rash noted. Not diaphoretic. No erythema. No pallor. Psychiatric: Normal mood and affect. Behavior, judgment, thought content normal.  Lab Results  Component Value Date   WBC 8.1 02/08/2014   HGB 7.7* 02/08/2014   HCT 26.2* 02/08/2014   MCV 70.4* 02/08/2014   PLT 181 02/08/2014   Lab Results  Component Value Date   CREATININE 0.51 02/08/2014   BUN 9 02/08/2014   NA 138 02/08/2014   K 4.7 02/08/2014   CL 103 02/08/2014   CO2 21 02/08/2014    No results found for this basename: HGBA1C   Lipid Panel  No results found for this basename: chol, trig, hdl, cholhdl, vldl, ldlcalc       Assessment and plan:   1. Erosive gastritis - Reviewed pathology report and H. Pylori negative. Will continue Omeprazole for another 4 weeks for a total of 8 weeks of therapy. - omeprazole (PRILOSEC) 40 MG capsule; Take 1 capsule (40 mg total) by mouth daily.  Dispense: 30 capsule; Refill: 0  2. Anemia, iron deficiency - Will re-check CBC wit diff and reticulocyte count today. Continue oral Iron supplementation. - Reticulocytes - CBC with Differential  3. Thyroid nodule - Will check TSH. If TSH low then will refer for thyroid scan, If TSH normal or High will refer for FNAB. - TSH  4. Weight loss, non-intentional - reviewed pt's history, metabolic profile and CBC with diff which do not offer an explanation. Will check TSH. - TSH  5. Hyperglycemia - Review of fasting glucose shows elevation. Will check Hb A1c  - Hemoglobin A1C   Follow- up in 1 month  The patient was given clear instructions to go to ER or return  to medical center if symptoms don't improve, worsen or new problems develop. The patient verbalized understanding. The patient was told to call to get lab results if they haven't heard anything in the next week.     This note has been created with Education officer, environmentalDragon speech recognition software and smart phrase technology. Any transcriptional errors are unintentional.    Rocklin Soderquist A., MD [facility] [place], [place] [phone]   04/03/2014, 3:33 PM

## 2014-04-04 LAB — CBC WITH DIFFERENTIAL/PLATELET
BASOS ABS: 0.1 10*3/uL (ref 0.0–0.1)
BASOS PCT: 1 % (ref 0–1)
EOS PCT: 3 % (ref 0–5)
Eosinophils Absolute: 0.2 10*3/uL (ref 0.0–0.7)
HCT: 38.1 % (ref 36.0–46.0)
Hemoglobin: 12.7 g/dL (ref 12.0–15.0)
LYMPHS PCT: 29 % (ref 12–46)
Lymphs Abs: 1.9 10*3/uL (ref 0.7–4.0)
MCH: 28.3 pg (ref 26.0–34.0)
MCHC: 33.3 g/dL (ref 30.0–36.0)
MCV: 84.9 fL (ref 78.0–100.0)
Monocytes Absolute: 0.3 10*3/uL (ref 0.1–1.0)
Monocytes Relative: 5 % (ref 3–12)
Neutro Abs: 4 10*3/uL (ref 1.7–7.7)
Neutrophils Relative %: 62 % (ref 43–77)
Platelets: 194 10*3/uL (ref 150–400)
RBC: 4.49 MIL/uL (ref 3.87–5.11)
RDW: 21 % — AB (ref 11.5–15.5)
WBC: 6.4 10*3/uL (ref 4.0–10.5)

## 2014-04-04 LAB — RETICULOCYTES
ABS Retic: 26.9 10*3/uL (ref 19.0–186.0)
RBC.: 4.49 MIL/uL (ref 3.87–5.11)
RETIC CT PCT: 0.6 % (ref 0.4–2.3)

## 2014-04-04 LAB — HEMOGLOBIN A1C
Hgb A1c MFr Bld: 5 % (ref <5.7)
Mean Plasma Glucose: 97 mg/dL (ref <117)

## 2014-04-04 LAB — TSH: TSH: 0.429 u[IU]/mL (ref 0.350–4.500)

## 2014-04-09 NOTE — Progress Notes (Signed)
Patient ID: Meghan LibmanAundria D Handley, female   DOB: 01/12/1986, 28 y.o.   MRN: [id number] TSH normal in setting of Thyroid nodule. Will order FNAB and make referral based on results.

## 2014-04-09 NOTE — Addendum Note (Signed)
Addended by: Marthann SchillerMATTHEWS, MICHELLE A on: 04/09/2014 04:22 PM   Modules accepted: Orders

## 2014-04-10 ENCOUNTER — Telehealth: Payer: Self-pay

## 2014-04-10 DIAGNOSIS — E041 Nontoxic single thyroid nodule: Secondary | ICD-10-CM

## 2014-04-10 NOTE — Telephone Encounter (Signed)
Meghan Bennett,  Dr. Ashley RoyaltyMatthews wants patient to have a FNAB of Thyroid Nodule. She put in referral for interventional radiology, but did not put in order for biopsy. Can you please put in order for ultrasound biopsy of the thyroid, so I can schedule for patient. Thank you!

## 2014-04-11 ENCOUNTER — Other Ambulatory Visit: Payer: Self-pay | Admitting: Family Medicine

## 2014-04-11 DIAGNOSIS — E041 Nontoxic single thyroid nodule: Secondary | ICD-10-CM

## 2014-04-25 ENCOUNTER — Inpatient Hospital Stay: Admission: RE | Admit: 2014-04-25 | Payer: Medicaid Other | Source: Ambulatory Visit

## 2014-04-27 ENCOUNTER — Telehealth: Payer: Self-pay | Admitting: Internal Medicine

## 2014-04-27 NOTE — Telephone Encounter (Signed)
Patient called about biopsy appointment time. Appointment was 04/25/14. Gave patient number to call to reschedule. Tammy [phone].

## 2014-05-04 ENCOUNTER — Inpatient Hospital Stay: Admission: RE | Admit: 2014-05-04 | Payer: Medicaid Other | Source: Ambulatory Visit

## 2014-05-04 ENCOUNTER — Ambulatory Visit: Payer: Medicaid Other | Admitting: Internal Medicine

## 2014-05-11 ENCOUNTER — Other Ambulatory Visit (HOSPITAL_COMMUNITY)
Admission: RE | Admit: 2014-05-11 | Discharge: 2014-05-11 | Disposition: A | Discharge status: Home / Self Care | Payer: Medicaid Other | Source: Ambulatory Visit | Attending: Interventional Radiology | Admitting: Interventional Radiology

## 2014-05-11 ENCOUNTER — Ambulatory Visit
Admission: RE | Admit: 2014-05-11 | Discharge: 2014-05-11 | Disposition: A | Discharge status: Home / Self Care | Payer: Medicaid Other | Source: Ambulatory Visit | Attending: Family Medicine | Admitting: Family Medicine

## 2014-05-11 DIAGNOSIS — E041 Nontoxic single thyroid nodule: Secondary | ICD-10-CM

## 2014-10-12 IMAGING — CT CT NECK W/ CM
3 of 4 series · 16 of 33 positions shown, 19 images · IV contrast (omnipaque)
Comparison: None.

CLINICAL DATA: Sore throat right greater than left. Improving with
IV antibiotics.

EXAM:
CT NECK WITH CONTRAST
TECHNIQUE: Multidetector CT imaging of the neck was performed using the
standard protocol following the bolus administration of intravenous
contrast.
CONTRAST:  75mL OMNIPAQUE IOHEXOL 300 MG/ML  SOLN

[Series 3: neck 2.0 i31s 3 · axial · 0.37mm/px · z∈[-258,-90]mm · 8 of 110 slices shown, 10 images]
[im 13/110  soft-tissue]
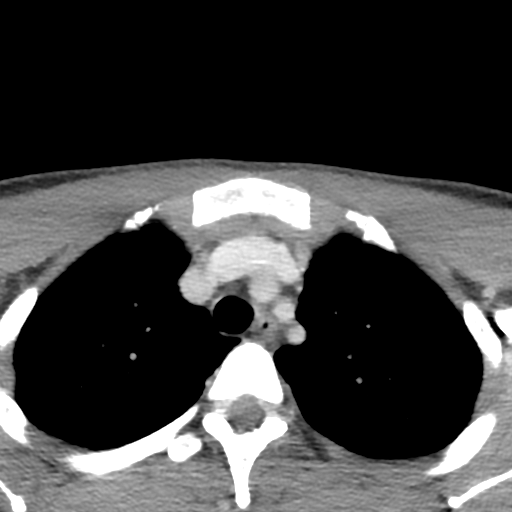
[im 13/110  bone]
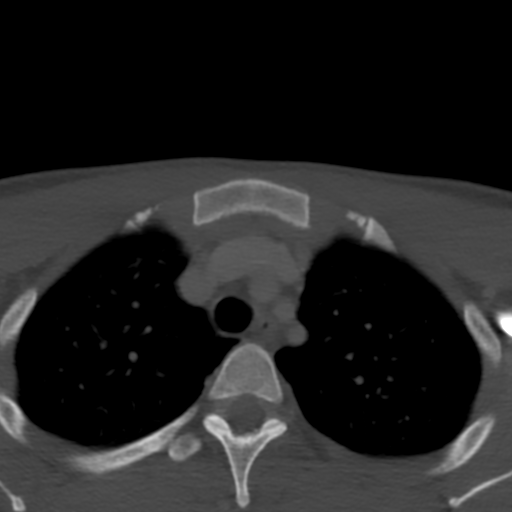
[im 25/110  bone]
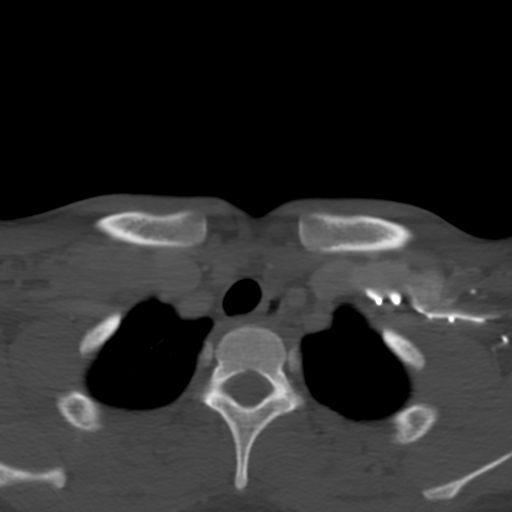
[im 37/110  bone]
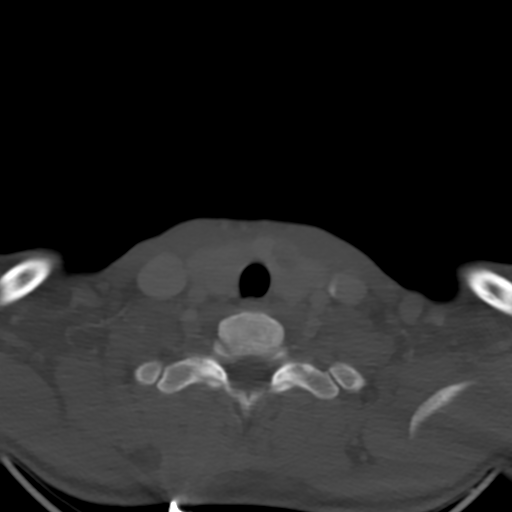
[im 49/110  bone]
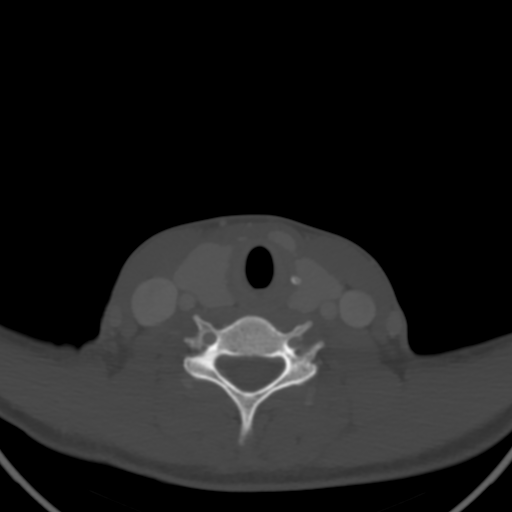
[im 61/110  soft-tissue]
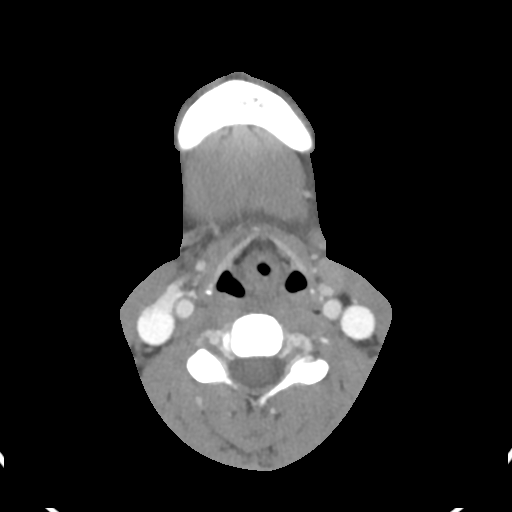
[im 61/110  bone]
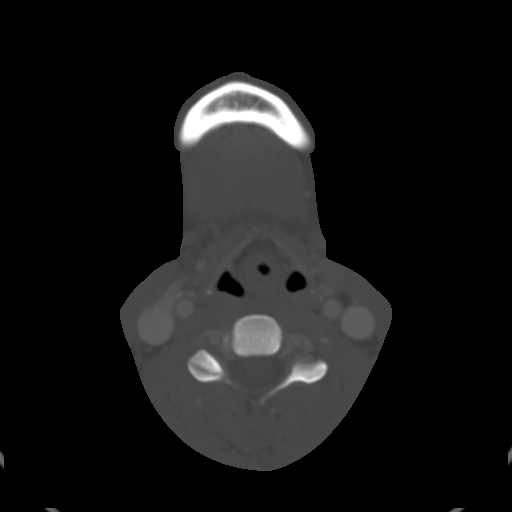
[im 73/110  bone]
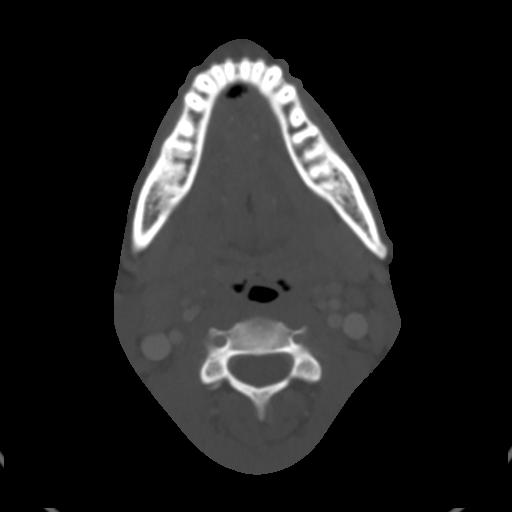
[im 85/110  bone]
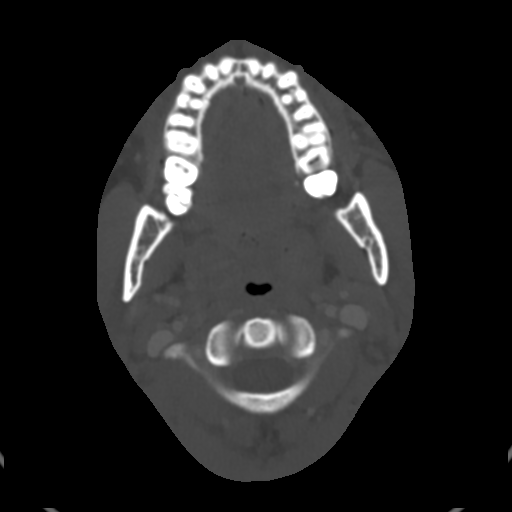
[im 97/110  bone]
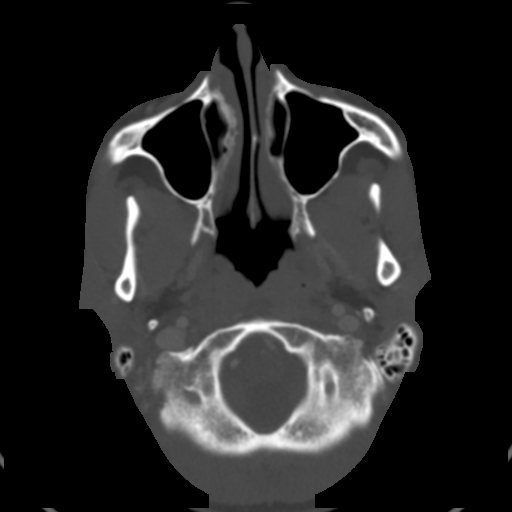

[Series 602: coronal · coronal · 0.43mm/px · 3 of 55 slices shown]
[im 11/55  bone]
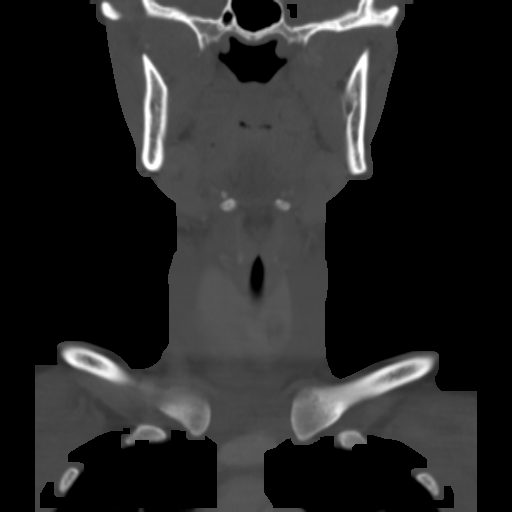
[im 22/55  bone]
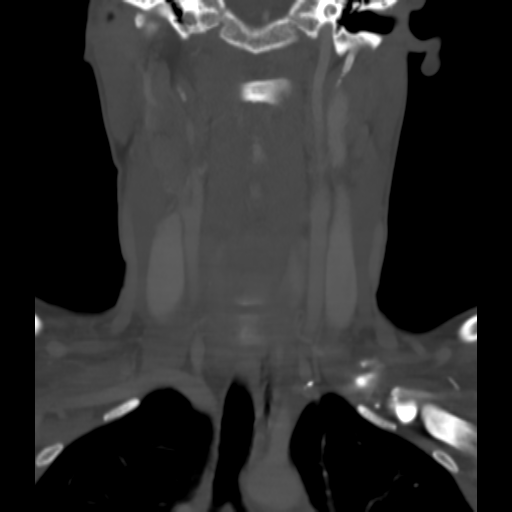
[im 33/55  bone]
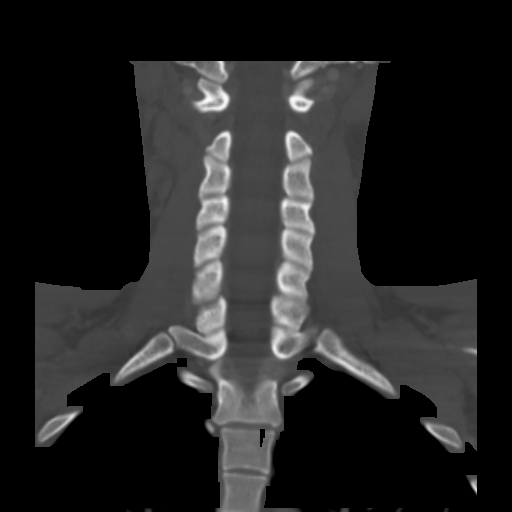

[Series 603: sagittal · sagittal · 0.43mm/px · 5 of 58 slices shown, 6 images]
[im 20/58  bone]
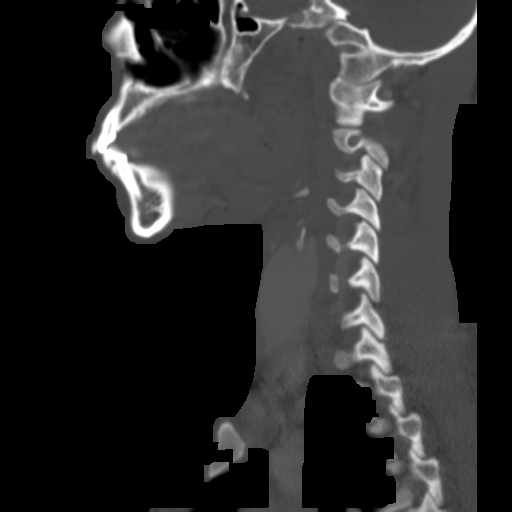
[im 24/58  bone]
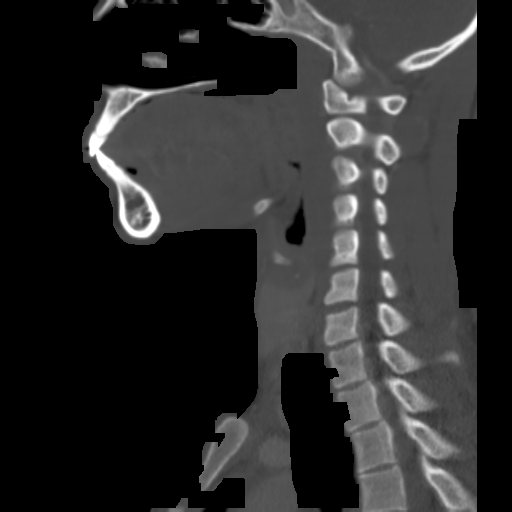
[im 29/58  soft-tissue]
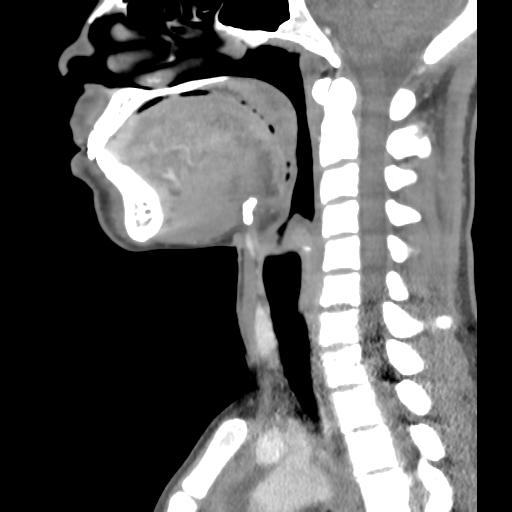
[im 29/58  bone]
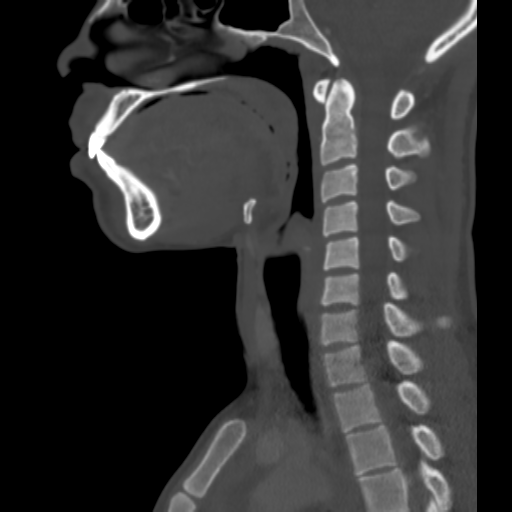
[im 34/58  bone]
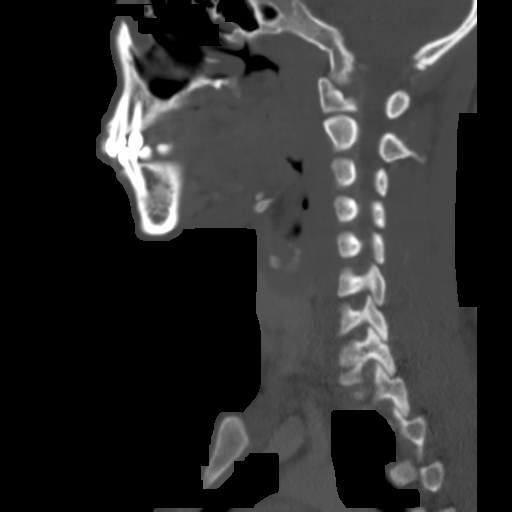
[im 39/58  bone]
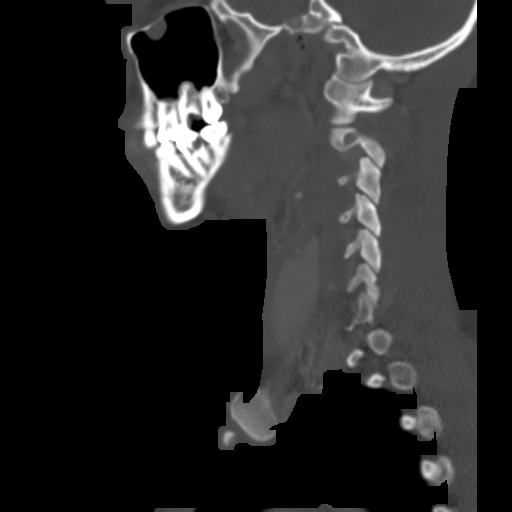

[16 of 33 positions shown; findings below may reference images not displayed]

FINDINGS: The palatine tonsils are enlarged and heterogeneous attenuation,
with the right slightly larger than the left. There is no formed
fluid collection to suggest a tonsillar abscess. The parapharyngeal
fat is well preserved bilaterally.

There is bilateral adenopathy along the jugular chains, the largest
node 80 right level-II lymph node measuring 12 mm in short axis.

A 13 mm hypo attenuating lesion lies in the left thyroid lobe.

No other abnormalities. Lung apices are clear. Airway is widely
patent. Visualized portions of the globes and orbits are
unremarkable. There is a small focus of mucosal thickening versus a
mucous retention cyst in the anterior left maxillary sinus.
Remaining visualized sinuses and mastoid air cells are clear.
IMPRESSION: 1. Inflamed palatine tonsils with no evidence of a tonsillar or
peritonsillar abscess. There is associated reactive adenopathy.
2. Widely patent airway.
3. 13 mm left thyroid lobe nodule.

## 2014-10-12 IMAGING — US US TRANSVAGINAL NON-OB
1 series · 14 of 25 positions shown · non-contrast
Comparison: None

CLINICAL DATA: Menorrhagia.



[Series 1: us transvaginal non-ob · 0.24mm/px · 14 of 76 slices shown]
[im 1/76]
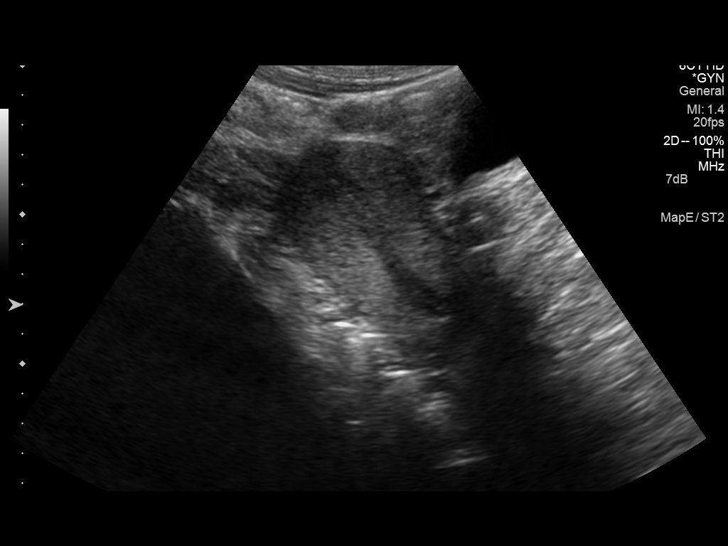
[im 7/76]
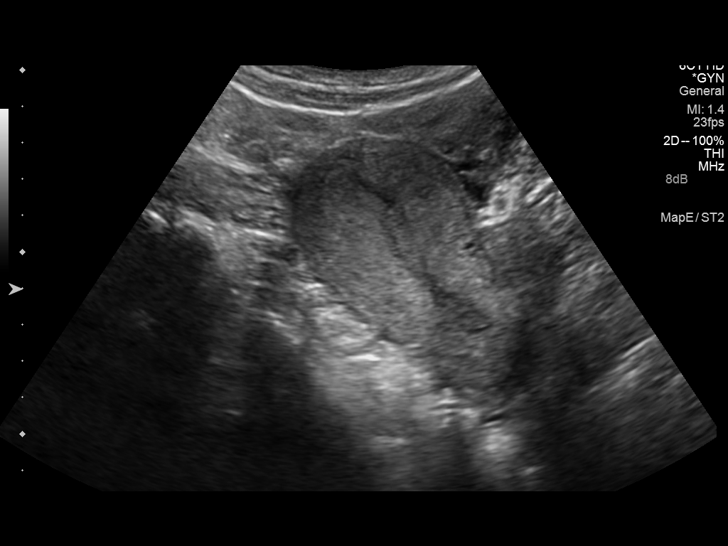
[im 13/76]
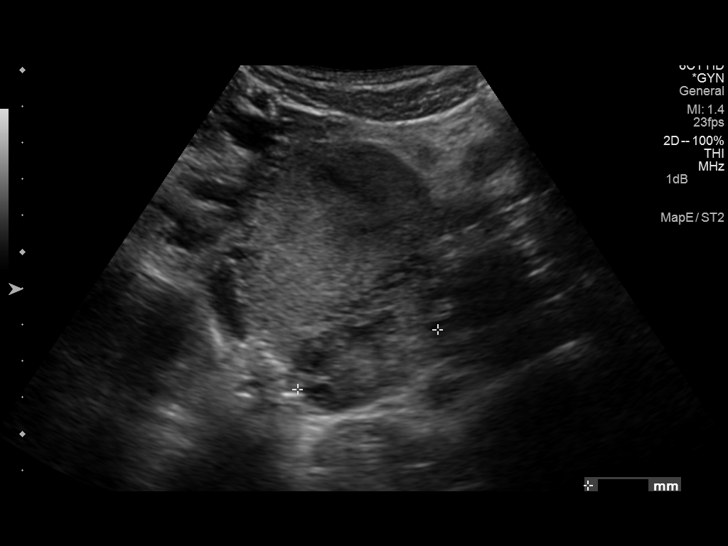
[im 19/76]
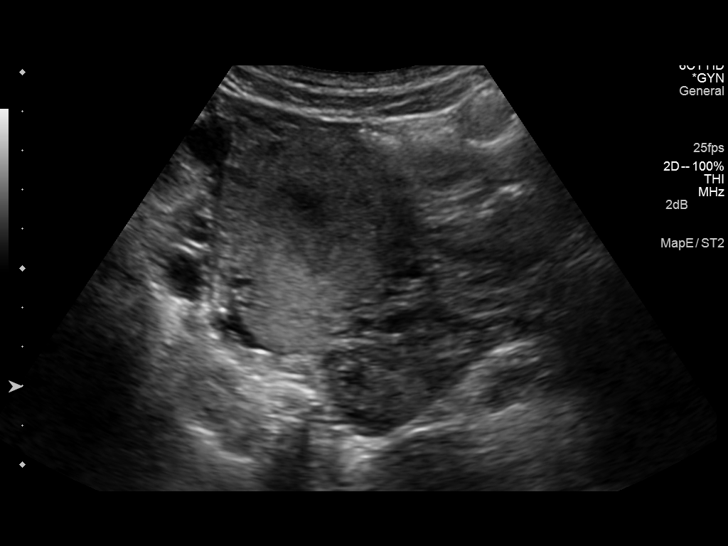
[im 26/76]
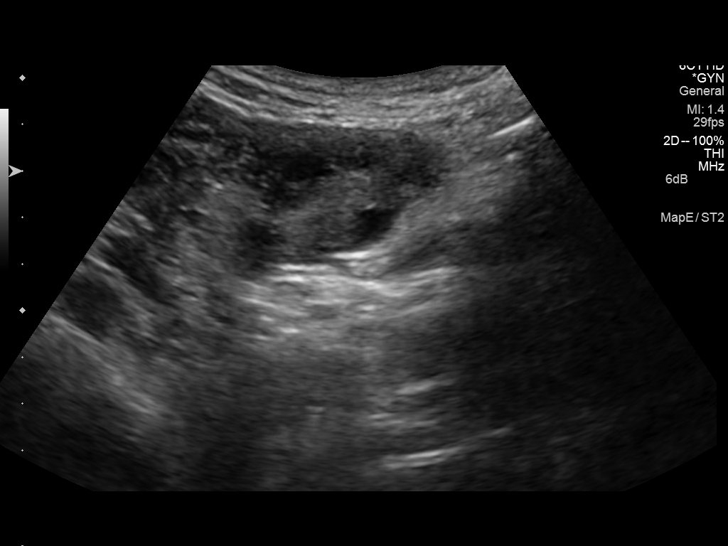
[im 29/76]
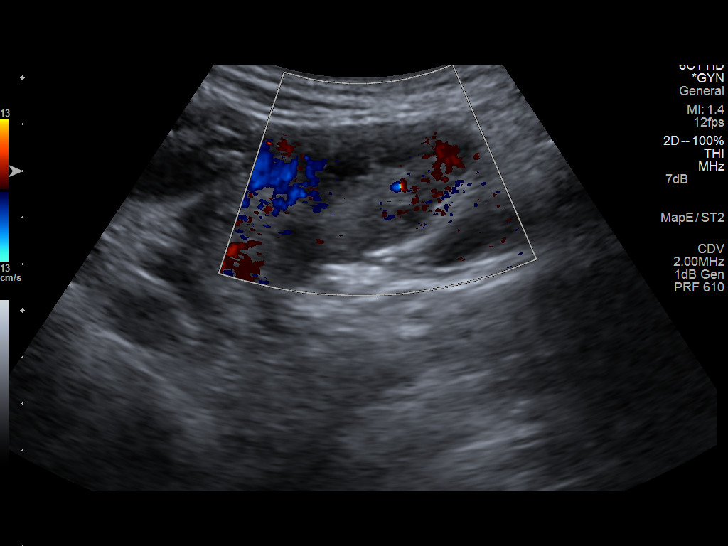
[im 35/76]
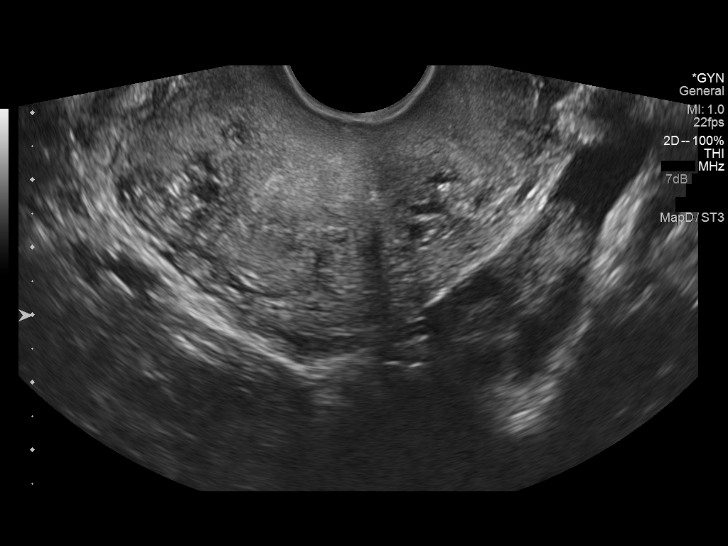
[im 41/76]
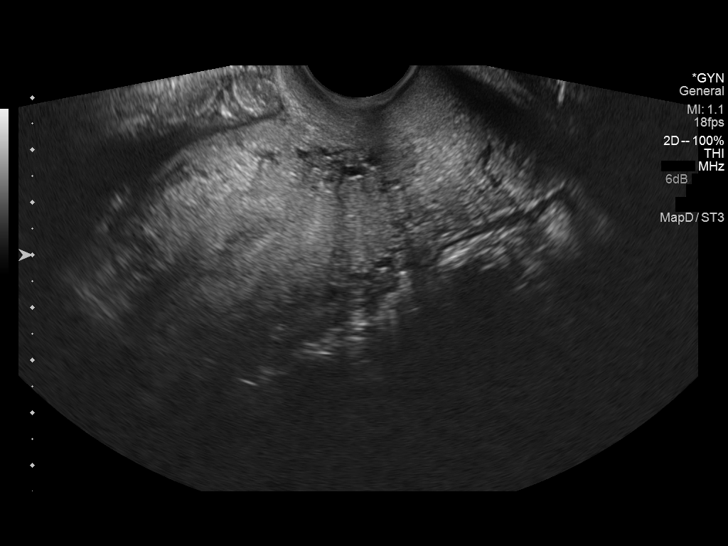
[im 47/76]
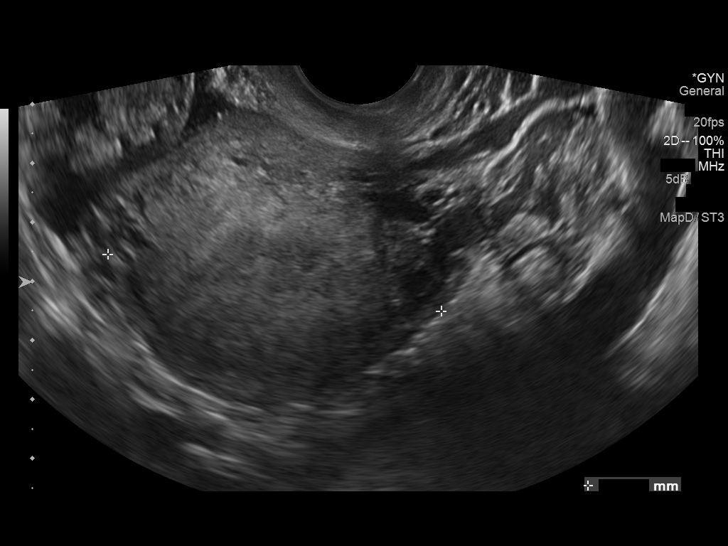
[im 51/76]
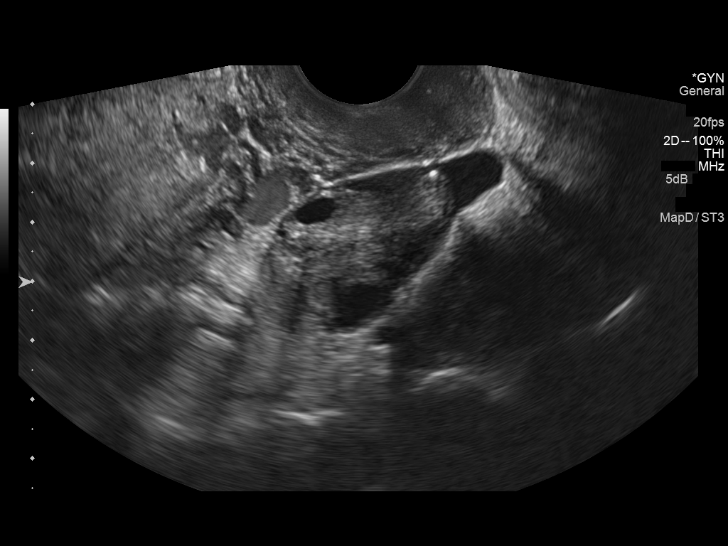
[im 57/76]
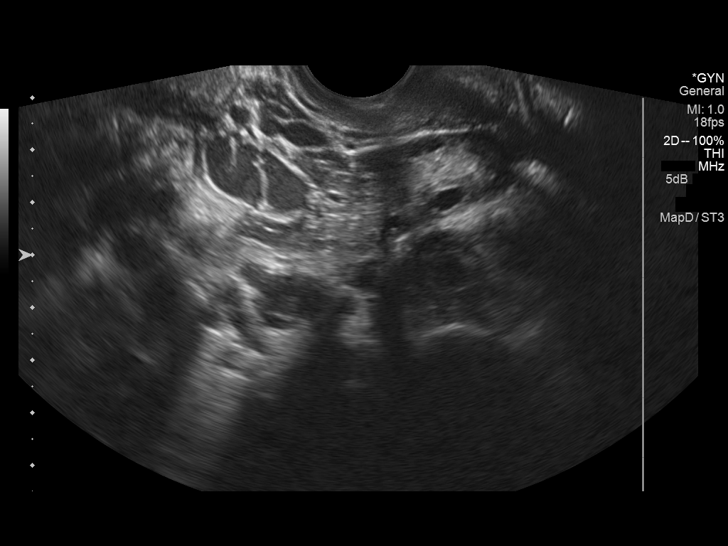
[im 63/76]
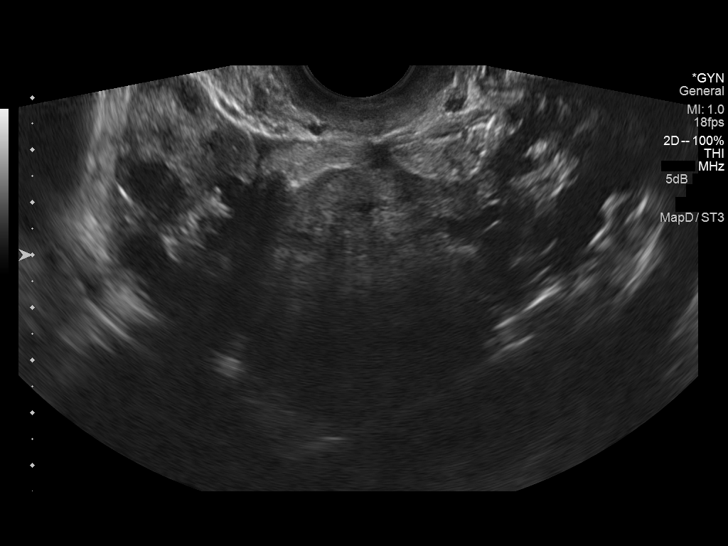
[im 69/76]
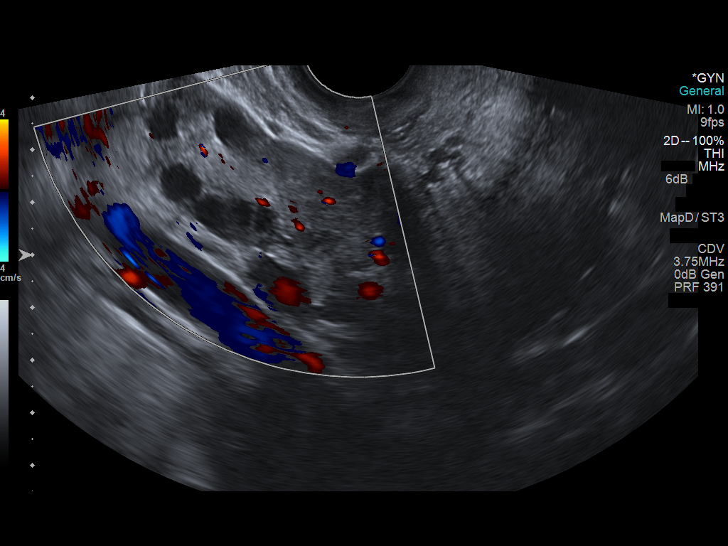
[im 76/76]
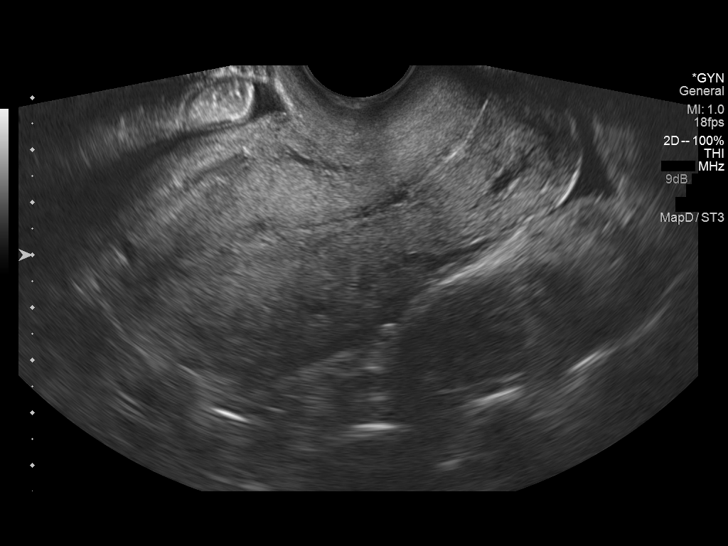

[14 of 25 positions shown; findings below may reference images not displayed]

FINDINGS: Uterus

Measurements: 9.4 x 5.7 x 4.6 cm. No fibroids or other mass
visualized.

Endometrium

Thickness: 4.9 mm.  No focal abnormality visualized.

Right ovary

Measurements: 5.6 x 2.8 x 2.8 cm. Normal appearance/no adnexal mass.

Left ovary

Measurements: 3.7 x 3.3 x 2.1 cm. Normal appearance/no adnexal mass.

Other findings

Trace amount of free fluid is noted in the pelvis.
IMPRESSION: No significant abnormality seen in the pelvis.

## 2015-01-12 IMAGING — US US THYROID BIOPSY
1 series · 10 of 10 positions shown · non-contrast
Comparison: None.

CLINICAL DATA: Complex left thyroid nodule

EXAM:
ULTRASOUND GUIDED NEEDLE ASPIRATE BIOPSY OF THE THYROID GLAND

[Series 1: us thyroid biopsy · 0.06mm/px · 10 acquisitions, 10 frames shown]
[im 1/10]
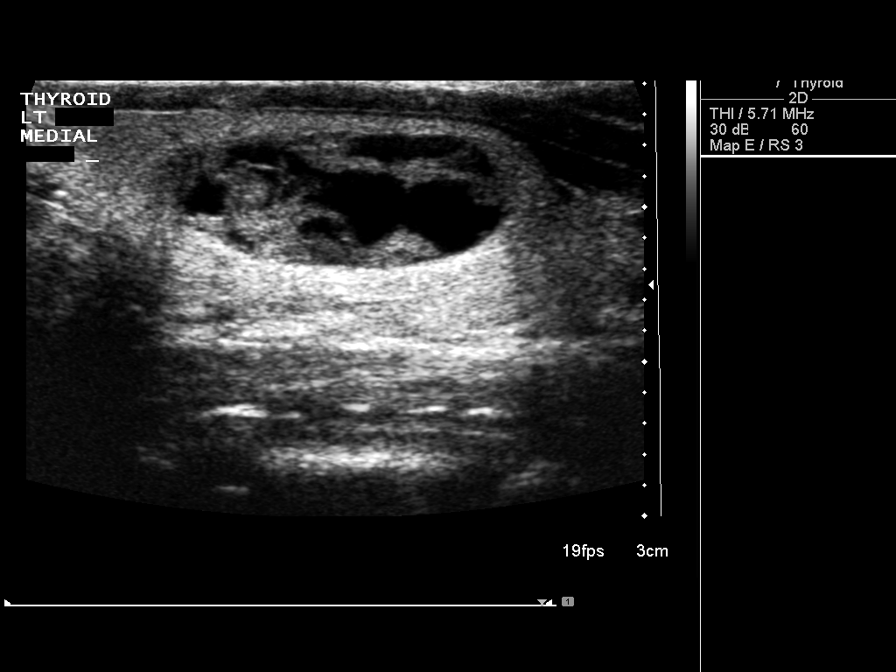
[im 2/10]
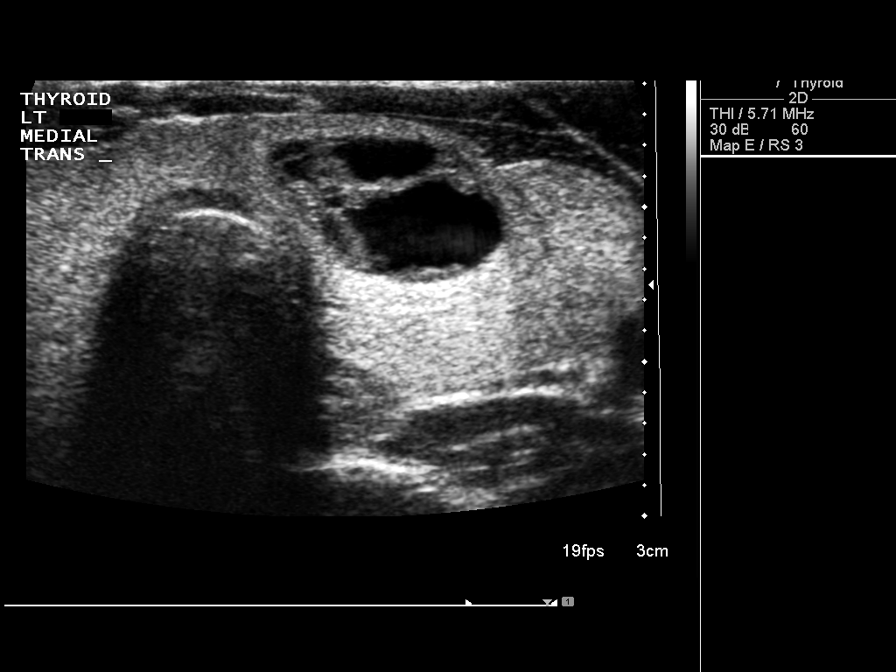
[im 3/10]
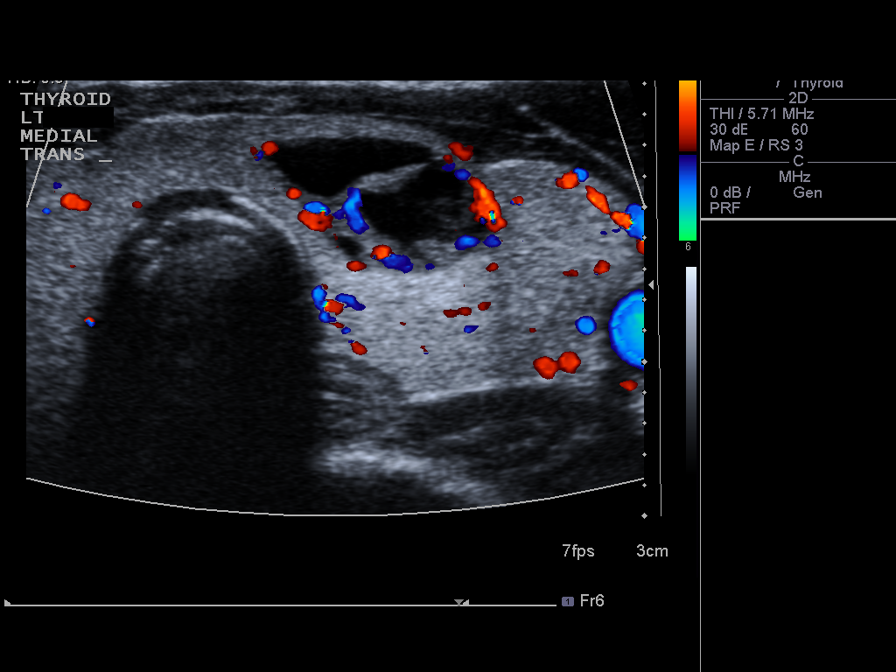
[im 4/10]
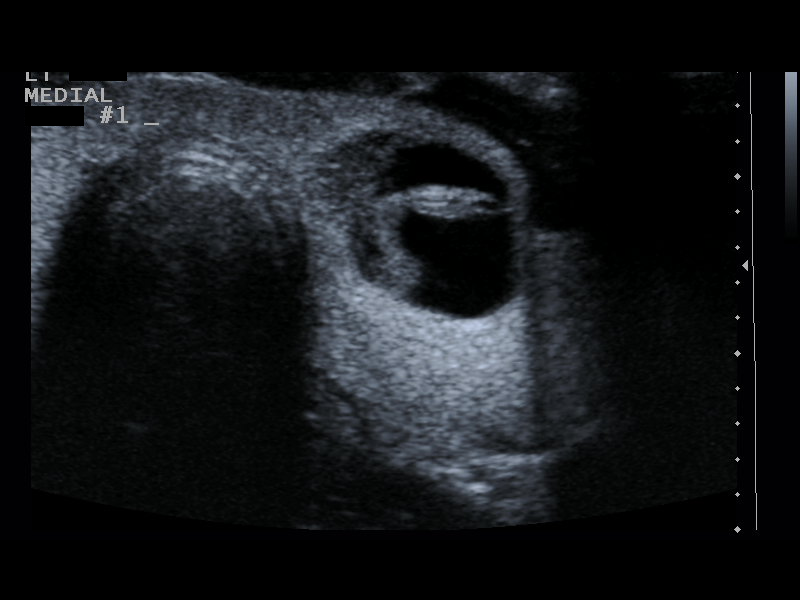
[im 5/10]
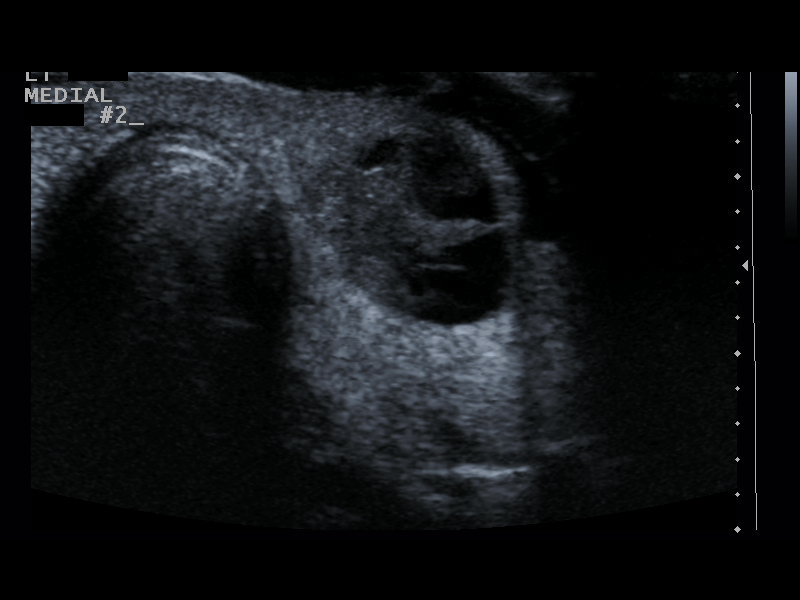
[im 6/10]
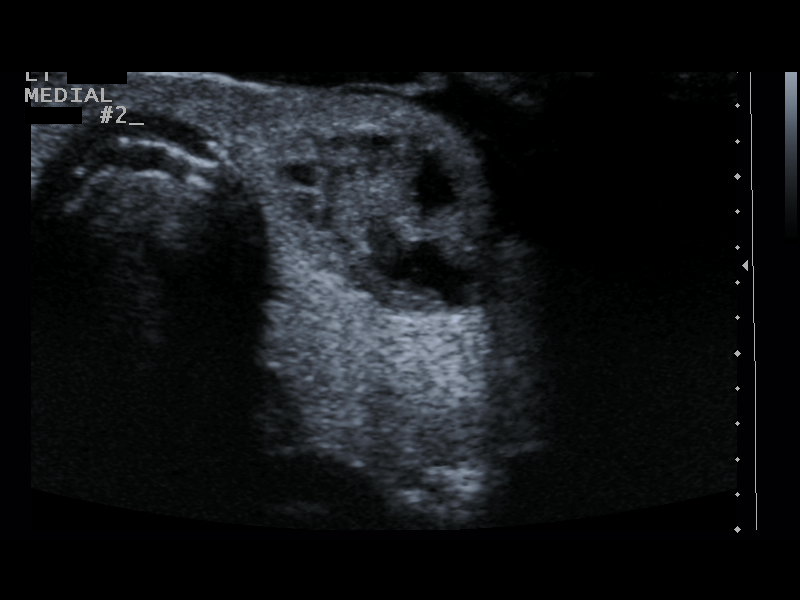
[im 7/10]
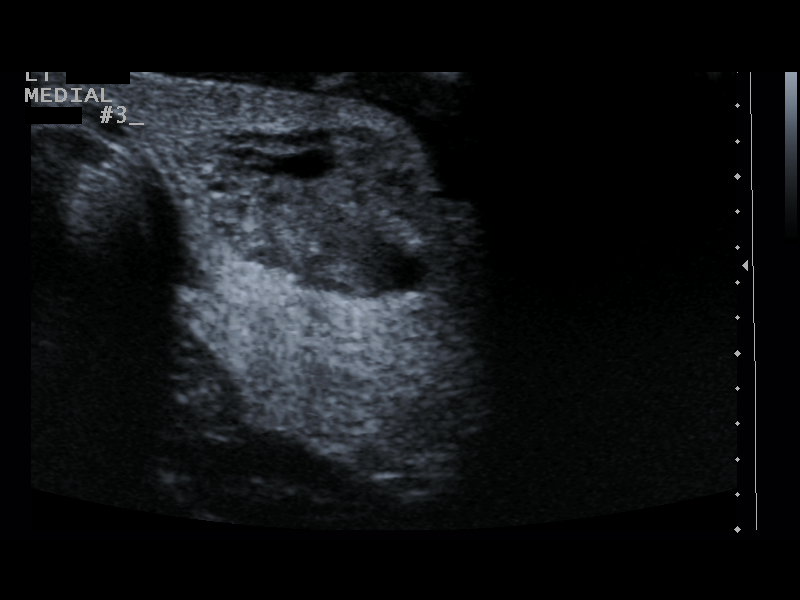
[im 8/10]
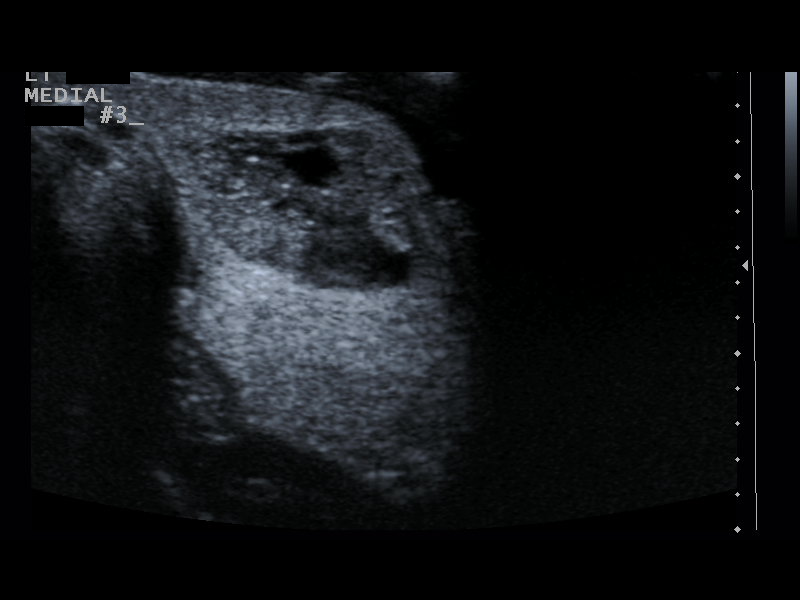
[im 9/10]
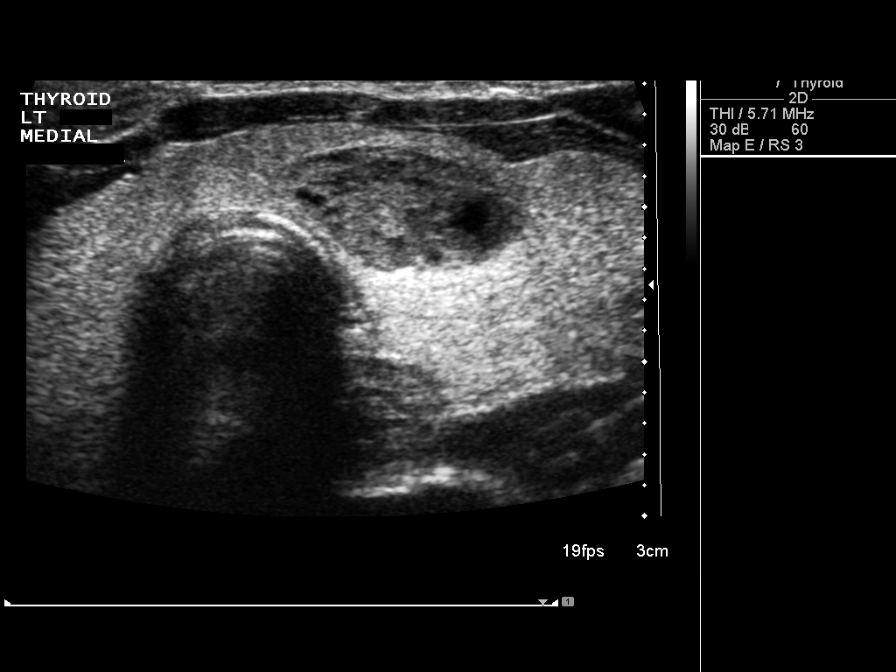
[im 10/10]
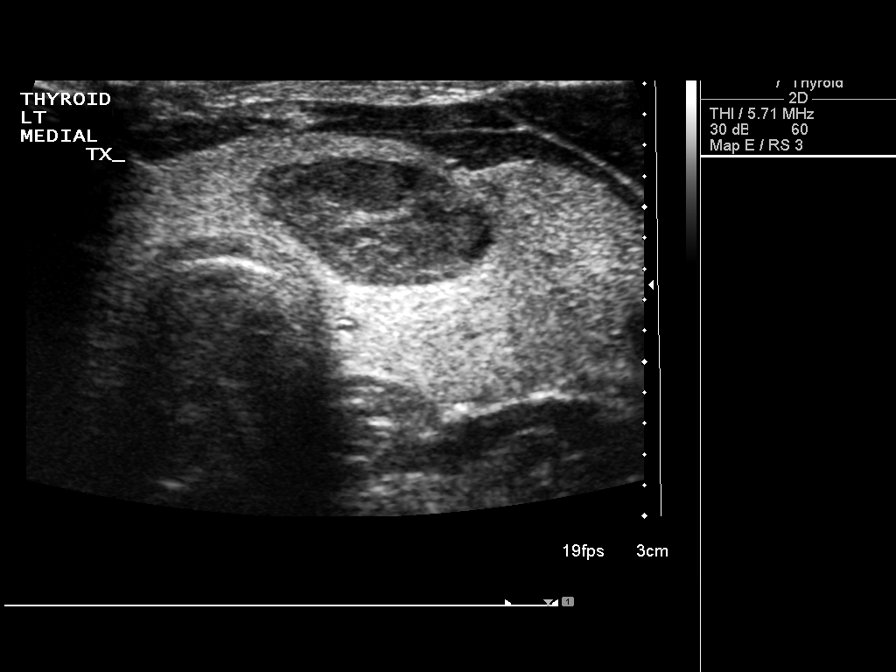

[10 of 10 positions shown; findings below may reference images not displayed]

PROCEDURE:
Thyroid biopsy was thoroughly discussed with the patient and
questions were answered. The benefits, risks, alternatives, and
complications were also discussed. The patient understands and
wishes to proceed with the procedure. Written consent was obtained.

Ultrasound was performed to localize and mark an adequate site for
the biopsy. The patient was then prepped and draped in a normal
sterile fashion. Local anesthesia was provided with 1% lidocaine.
Using direct ultrasound guidance, 3 passes were made using needles
into the nodule within the left lobe of the thyroid. Ultrasound was
used to confirm needle placements on all occasions. Specimens were
sent to Pathology for analysis.

Complications:  None
FINDINGS: Images document needle placement in the complex left thyroid nodule.
IMPRESSION: Ultrasound guided needle aspirate biopsy performed of the left
thyroid nodule.

## 2015-01-12 IMAGING — US US SOFT TISSUE HEAD/NECK
1 series · 14 of 25 positions shown · non-contrast
Comparison: None.

CLINICAL DATA: Thyroid nodule noted on CT

EXAM:
THYROID ULTRASOUND
TECHNIQUE: Ultrasound examination of the thyroid gland and adjacent soft
tissues was performed.

[Series 1: us soft tissue head/neck · 0.08mm/px · 14 of 51 slices shown]
[im 1/51]
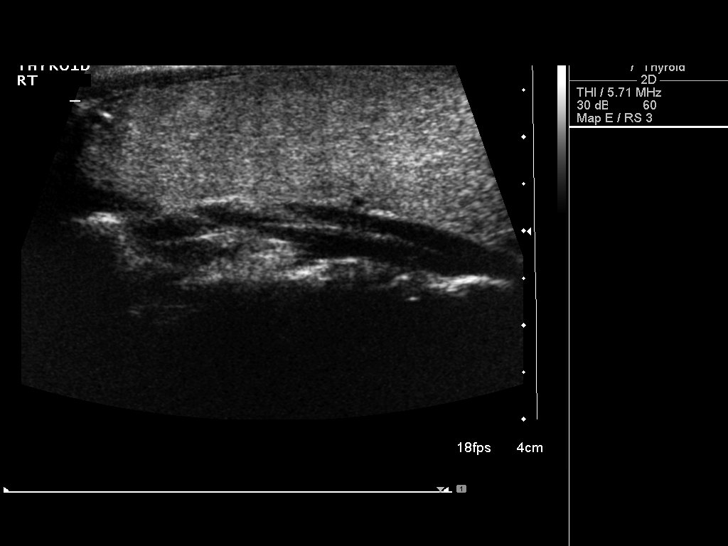
[im 5/51]
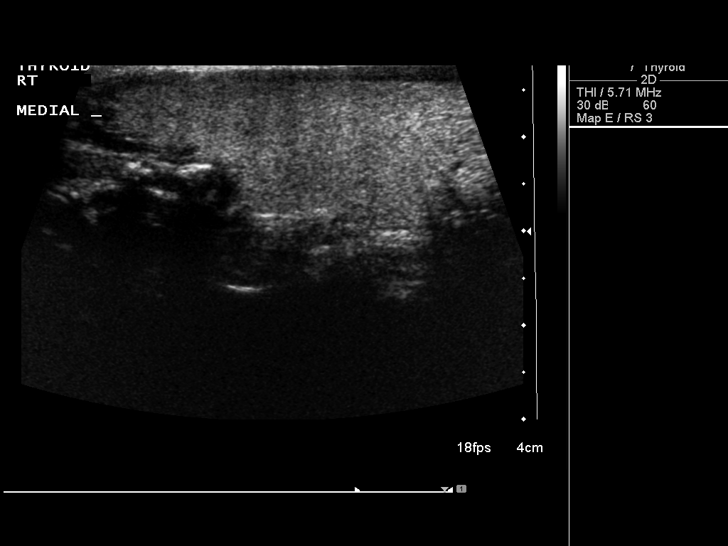
[im 9/51]
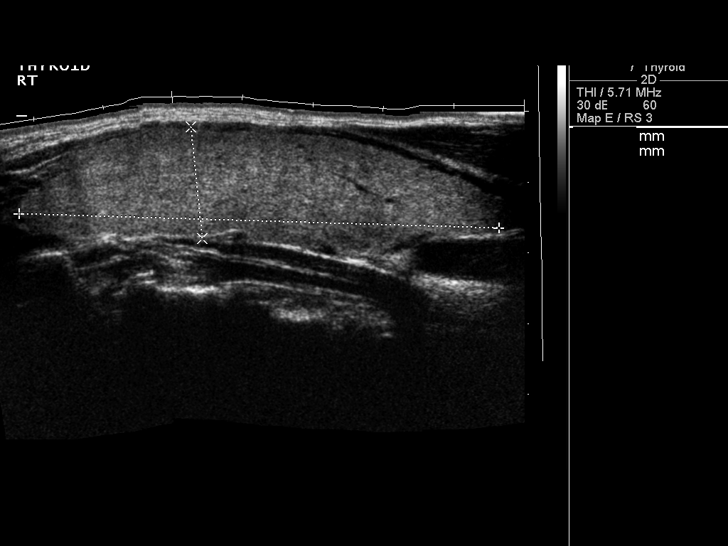
[im 13/51]
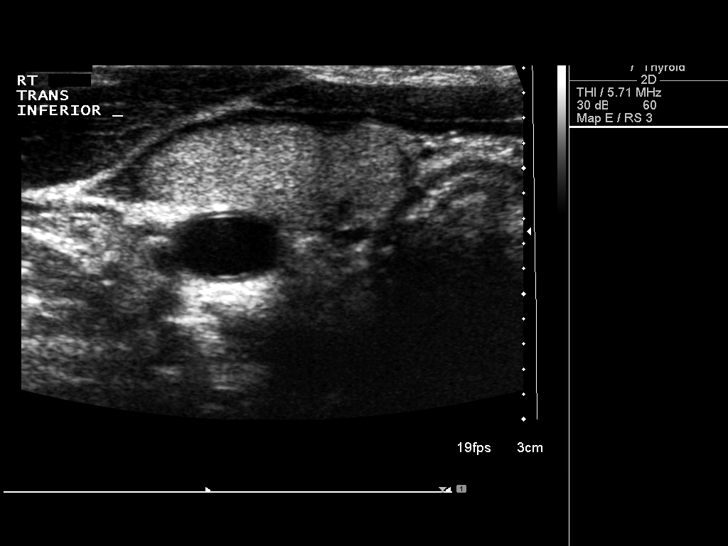
[im 17/51]
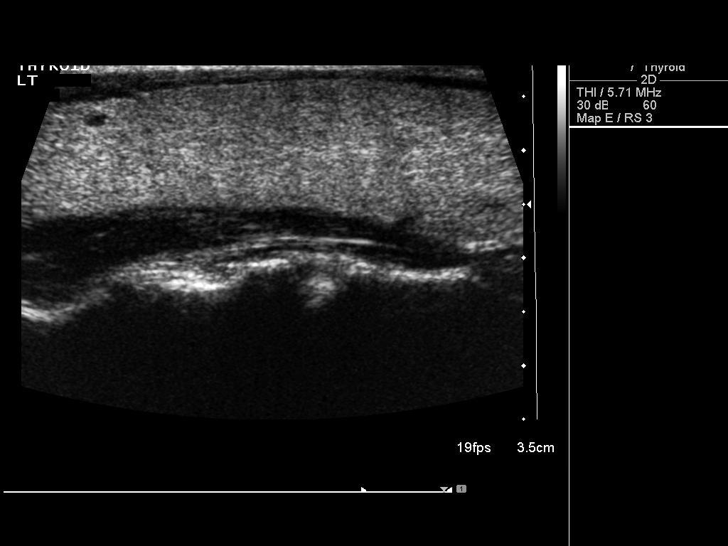
[im 19/51]
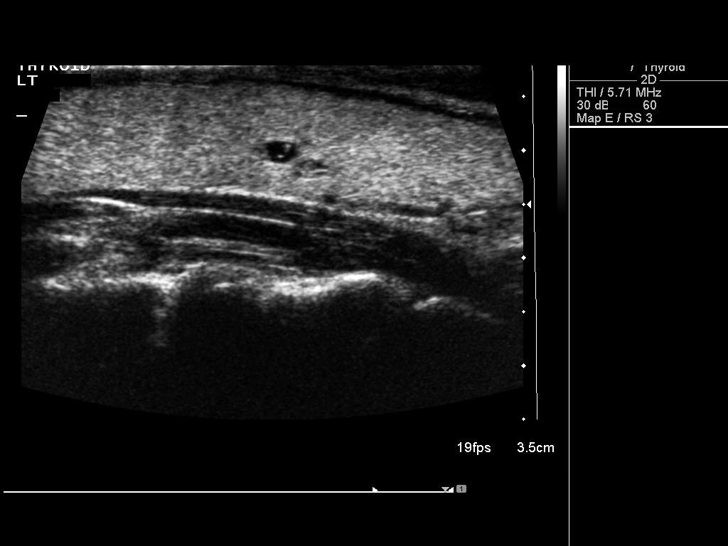
[im 23/51]
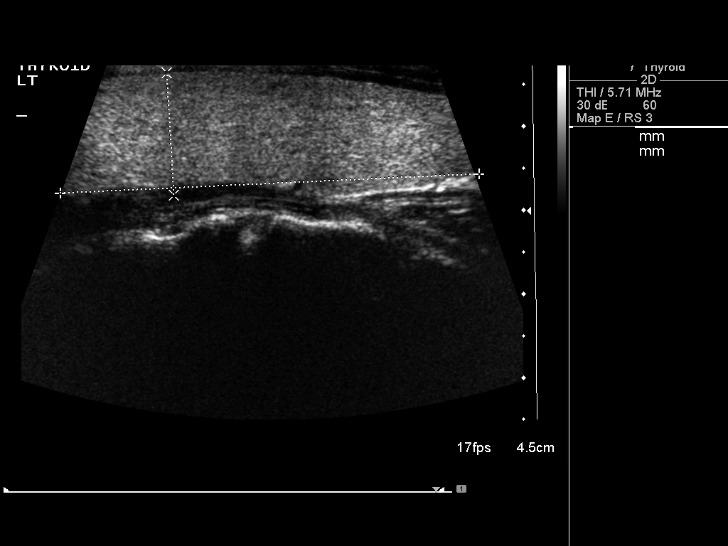
[im 28/51]
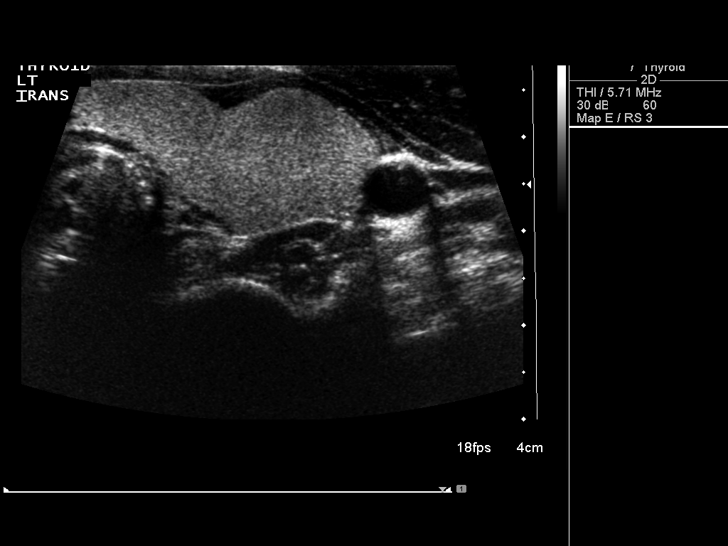
[im 32/51]
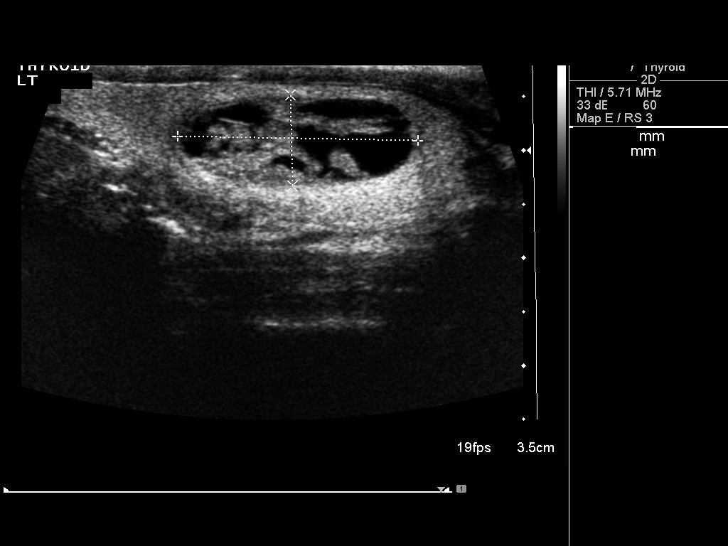
[im 34/51]
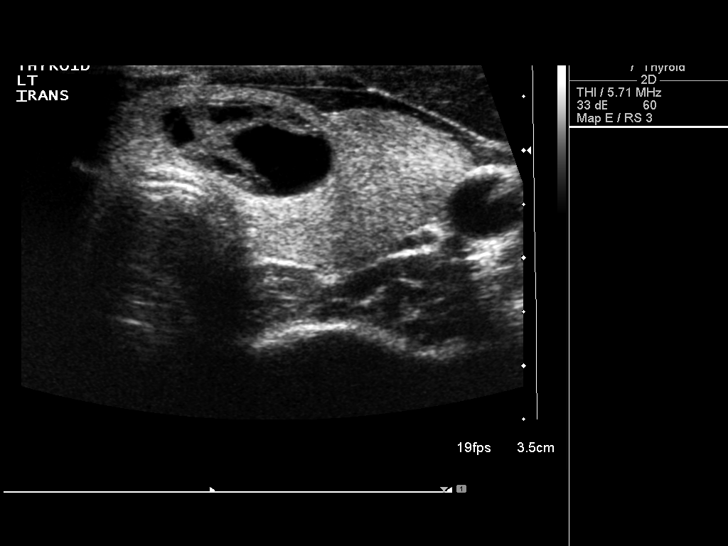
[im 38/51]
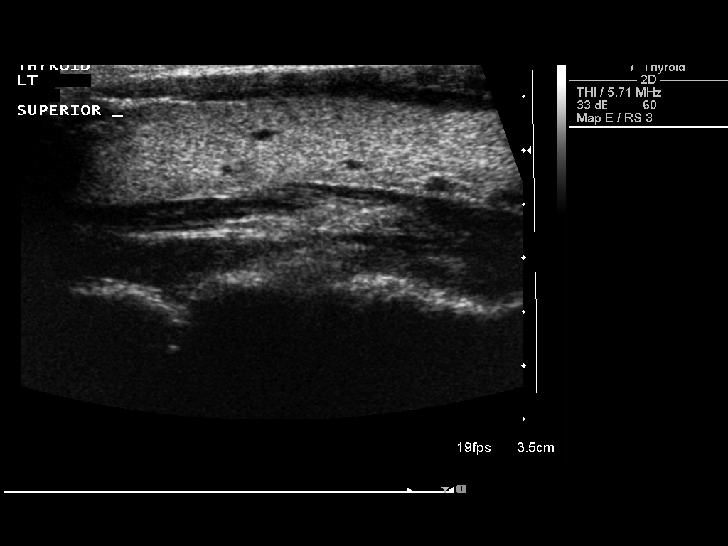
[im 42/51]
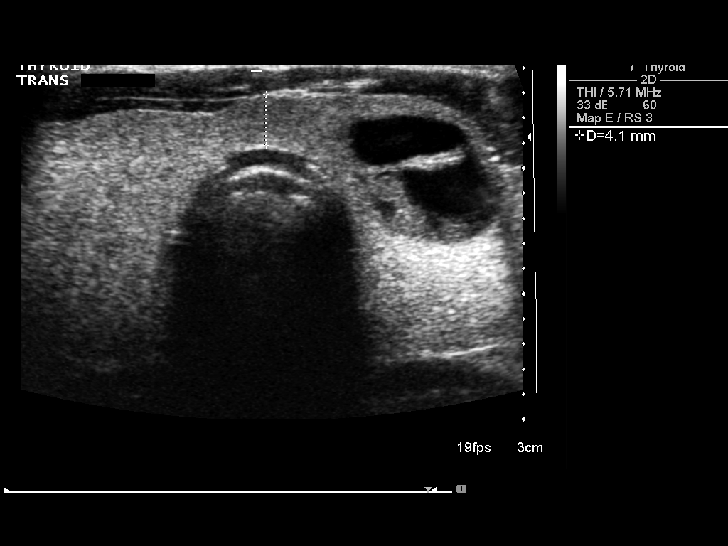
[im 46/51]
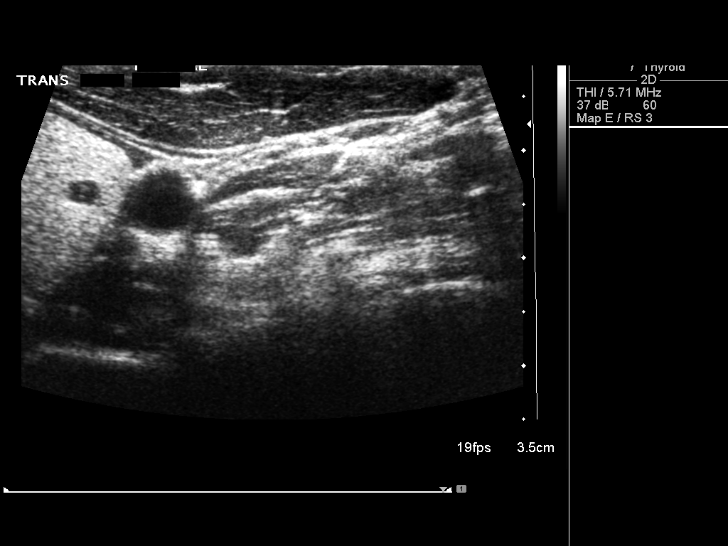
[im 51/51]
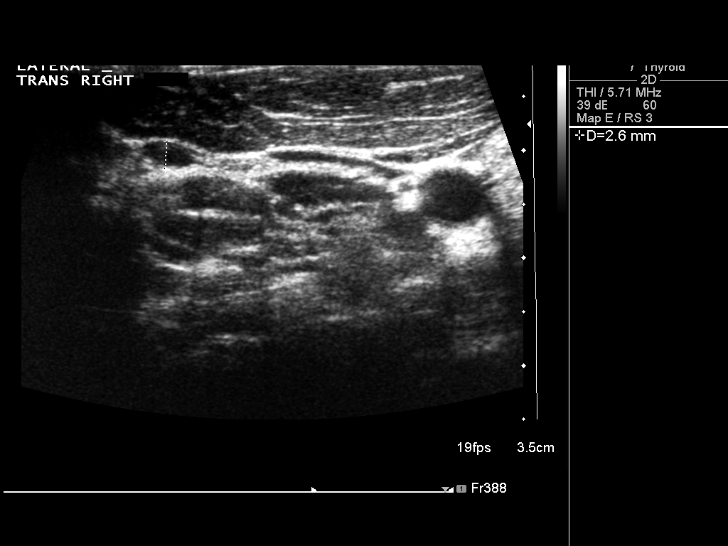

[14 of 25 positions shown; findings below may reference images not displayed]

FINDINGS: Right thyroid lobe

Measurements: 6.8 x 1.6 x 3.0 cm.  No nodules visualized.

Left thyroid lobe

Measurements: 6.5 x 1.3 x 2.3 cm. 2.2 x 0.9 x 1.6 cm complex left
interpolar region nodule.

Isthmus

Thickness: 4 mm.  No nodules visualized.

Lymphadenopathy

None visualized.
IMPRESSION: 2.2 cm complex left thyroid nodule.  Biopsy is to follow.

## 2015-11-03 ENCOUNTER — Emergency Department (HOSPITAL_BASED_OUTPATIENT_CLINIC_OR_DEPARTMENT_OTHER)
Admission: EM | Admit: 2015-11-03 | Discharge: 2015-11-03 | Disposition: A | Discharge status: Home / Self Care | Payer: Medicaid Other | Attending: Emergency Medicine | Admitting: Emergency Medicine

## 2015-11-03 ENCOUNTER — Encounter (HOSPITAL_BASED_OUTPATIENT_CLINIC_OR_DEPARTMENT_OTHER): Payer: Self-pay | Admitting: *Deleted

## 2015-11-03 DIAGNOSIS — Z862 Personal history of diseases of the blood and blood-forming organs and certain disorders involving the immune mechanism: Secondary | ICD-10-CM | POA: Insufficient documentation

## 2015-11-03 DIAGNOSIS — J111 Influenza due to unidentified influenza virus with other respiratory manifestations: Secondary | ICD-10-CM | POA: Diagnosis not present

## 2015-11-03 DIAGNOSIS — F17210 Nicotine dependence, cigarettes, uncomplicated: Secondary | ICD-10-CM | POA: Insufficient documentation

## 2015-11-03 DIAGNOSIS — R69 Illness, unspecified: Secondary | ICD-10-CM

## 2015-11-03 DIAGNOSIS — R05 Cough: Secondary | ICD-10-CM | POA: Diagnosis present

## 2015-11-03 MED ORDER — HYDROCODONE-HOMATROPINE 5-1.5 MG/5ML PO SYRP: 5.0000 mL | ORAL_SOLUTION | Freq: Four times a day (QID) | ORAL | Status: DC | PRN

## 2015-11-03 NOTE — ED Notes (Signed)
Pt reports cough, productive with small amount of yellowish sputum, along with Body aches and chills + nausea for 2 days.  Denies vomiting or diarrhea.  "everyone in my family has the flu right now."

## 2015-11-03 NOTE — ED Provider Notes (Signed)
CSN: [id number]     Arrival date & time 11/03/15  1538 History  By signing my name below, I, Terrance Branch, attest that this documentation has been prepared under the direction and in the presence of Arby BarretteMarcy Carissa Musick, MD. Electronically Signed: Evon Slackerrance Branch, ED Scribe. 11/03/2015. 5:56 PM.     Chief Complaint  Patient presents with  . URI   The history is provided by the patient. No language interpreter was used.   HPI Comments: Meghan Bennett is a 30 y.o. female who presents to the Emergency Department complaining of URI symptoms onset 1 day prior. PT reports having cough, myalgias, chills and nausea. Pt states she has tried nyquil with no relief. Denies abdominal pain, vomiting, diarrhea, sore throat, HA, leg swelling.  She reports being around family member that have recently been diagnosed with the flu. Pt does report unrelated rash on the right side of her neck.   Past Medical History  Diagnosis Date  . Anemia    Past Surgical History  Procedure Laterality Date  . Tubal ligation  2011  . Esophagogastroduodenoscopy Left 02/09/2014    Procedure: ESOPHAGOGASTRODUODENOSCOPY (EGD);  Surgeon: Theda BelfastPatrick D Hung, MD;  [facility];  Service: Endoscopy;  Laterality: Left;   Family History  Problem Relation Age of Onset  . CAD Other   . Cancer Other   . Diabetes Mellitus II Neg Hx   . Anemia Neg Hx    Social History  Substance Use Topics  . Smoking status: Light Tobacco Smoker    Types: Cigarettes  . Smokeless tobacco: None  . Alcohol Use: Yes     Comment: occasional   OB History    No data available     Review of Systems  Constitutional: Positive for chills.  HENT: Negative for sore throat.   Respiratory: Positive for cough. Negative for shortness of breath.   Cardiovascular: Negative for leg swelling.  Gastrointestinal: Positive for nausea. Negative for vomiting, abdominal pain and diarrhea.  Musculoskeletal: Positive for myalgias.  Neurological: Negative for  headaches.  All other systems reviewed and are negative.    Allergies  Review of patient's allergies indicates no known allergies.  Home Medications   Prior to Admission medications   Medication Sig Start Date End Date Taking? Authorizing Provider  HYDROcodone-homatropine (HYCODAN) 5-1.5 MG/5ML syrup Take 5 mLs by mouth every 6 (six) hours as needed for cough. 5-10 ml every 4-6 hours as needed for cough 11/03/15   Arby BarretteMarcy Georgia Delsignore, MD   BP 118/88 mmHg  Pulse 116  Temp(Src) 99.2 F (37.3 C) (Oral)  Resp 18  Ht 5\' 2"  (1.575 m)  Wt 128 lb (58.06 kg)  BMI 23.41 kg/m2  SpO2 97%  LMP 11/03/2015   Physical Exam  Constitutional: She is oriented to person, place, and time. She appears well-developed and well-nourished. No distress.  HENT:  Head: Normocephalic and atraumatic.  Eyes: Conjunctivae and EOM are normal.  Neck: Neck supple. No tracheal deviation present.  Cardiovascular: Normal rate, regular rhythm and normal heart sounds.  Exam reveals no gallop and no friction rub.   No murmur heard. Pulmonary/Chest: Effort normal and breath sounds normal. No respiratory distress. She has no wheezes. She has no rales.  Abdominal: Soft. There is no tenderness.  Musculoskeletal: Normal range of motion.  Neurological: She is alert and oriented to person, place, and time.  Skin: Skin is warm and dry.  Psychiatric: She has a normal mood and affect. Her behavior is normal.  Nursing note and vitals  reviewed.   ED Course  Procedures (including critical care time) DIAGNOSTIC STUDIES: Oxygen Saturation is 97% on RA, normal by my interpretation.    COORDINATION OF CARE: 5:55 PM-Discussed treatment plan with pt at bedside and pt agreed to plan.     Labs Review Labs Reviewed - No data to display  Imaging Review No results found.    EKG Interpretation None      MDM   Final diagnoses:  Influenza-like illness   Patient is nontoxic in appearance. She has cough without wheeze or  dyspnea. All family members have similar illness. She does not have comorbid illnesses. Patient is given Hycodan for cough and advised to use ibuprofen and Tylenol for fever and body ache. Precautionary return instructions are reviewed.     Arby Barrette, MD 11/03/15 [phone]

## 2015-11-03 NOTE — ED Notes (Signed)
Pt reports cough, body aches, unknown fever x several days.  pts family tested postive for flu.

## 2015-11-03 NOTE — Discharge Instructions (Signed)
suspected Influenza, Adult Influenza ("the flu") is a viral infection of the respiratory tract. It occurs more often in winter months because people spend more time in close contact with one another. Influenza can make you feel very sick. Influenza easily spreads from person to person (contagious). CAUSES  Influenza is caused by a virus that infects the respiratory tract. You can catch the virus by breathing in droplets from an infected person's cough or sneeze. You can also catch the virus by touching something that was recently contaminated with the virus and then touching your mouth, nose, or eyes. RISKS AND COMPLICATIONS You may be at risk for a more severe case of influenza if you smoke cigarettes, have diabetes, have chronic heart disease (such as heart failure) or lung disease (such as asthma), or if you have a weakened immune system. Elderly people and pregnant women are also at risk for more serious infections. The most common problem of influenza is a lung infection (pneumonia). Sometimes, this problem can require emergency medical care and may be life threatening. SIGNS AND SYMPTOMS  Symptoms typically last 4 to 10 days and may include:  Fever.  Chills.  Headache, body aches, and muscle aches.  Sore throat.  Chest discomfort and cough.  Poor appetite.  Weakness or feeling tired.  Dizziness.  Nausea or vomiting. DIAGNOSIS  Diagnosis of influenza is often made based on your history and a physical exam. A nose or throat swab test can be done to confirm the diagnosis. TREATMENT  In mild cases, influenza goes away on its own. Treatment is directed at relieving symptoms. For more severe cases, your health care provider may prescribe antiviral medicines to shorten the sickness. Antibiotic medicines are not effective because the infection is caused by a virus, not by bacteria. HOME CARE INSTRUCTIONS  Take medicines only as directed by your health care provider.  Use a cool mist  humidifier to make breathing easier.  Get plenty of rest until your temperature returns to normal. This usually takes 3 to 4 days.  Drink enough fluid to keep your urine clear or pale yellow.  Cover yourmouth and nosewhen coughing or sneezing,and wash your handswellto prevent thevirusfrom spreading.  Stay homefromwork orschool untilthe fever is gonefor at least 501full day. PREVENTION  An annual influenza vaccination (flu shot) is the best way to avoid getting influenza. An annual flu shot is now routinely recommended for all adults in the [place] SEEK MEDICAL CARE IF:  You experiencechest pain, yourcough worsens,or you producemore mucus.  Youhave nausea,vomiting, ordiarrhea.  Your fever returns or gets worse. SEEK IMMEDIATE MEDICAL CARE IF:  You havetrouble breathing, you become short of breath,or your skin ornails becomebluish.  You have severe painor stiffnessin the neck.  You develop a sudden headache, or pain in the face or ear.  You have nausea or vomiting that you cannot control. MAKE SURE YOU:   Understand these instructions.  Will watch your condition.  Will get help right away if you are not doing well or get worse.   This information is not intended to replace advice given to you by your health care provider. Make sure you discuss any questions you have with your health care provider.   Document Released: 08/08/2000 Document Revised: 09/01/2014 Document Reviewed: 11/10/2011 Elsevier Interactive Patient Education [employer]2016 Elsevier Inc.

## 2016-05-03 LAB — CYTOLOGY - PAP

## 2017-05-21 ENCOUNTER — Ambulatory Visit (HOSPITAL_COMMUNITY)
Admission: EM | Admit: 2017-05-21 | Discharge: 2017-05-21 | Disposition: A | Discharge status: Home / Self Care | Payer: Self-pay | Attending: Family Medicine | Admitting: Family Medicine

## 2017-05-21 ENCOUNTER — Encounter (HOSPITAL_COMMUNITY): Payer: Self-pay | Admitting: Emergency Medicine

## 2017-05-21 DIAGNOSIS — J039 Acute tonsillitis, unspecified: Secondary | ICD-10-CM

## 2017-05-21 MED ORDER — HYDROCODONE-HOMATROPINE 5-1.5 MG/5ML PO SYRP: 5.0000 mL | ORAL_SOLUTION | Freq: Four times a day (QID) | ORAL | 0 refills | Status: DC | PRN

## 2017-05-21 MED ORDER — AMOXICILLIN 875 MG PO TABS: 875.0000 mg | ORAL_TABLET | Freq: Two times a day (BID) | ORAL | 0 refills | Status: DC

## 2017-05-21 NOTE — ED Triage Notes (Addendum)
Cough and congestion, gagging with cough.  Dr Milus Glazier in treatment room examining patient

## 2017-05-21 NOTE — ED Provider Notes (Signed)
  [facility]   [id number] 05/21/17 Arrival Time: 1010   SUBJECTIVE:  MY RINKE is a 31 y.o. female who presents to the urgent care with complaint of cold symptoms.  Onset was Monday.  Complains of cough, myalgia, and throat pain along with swollen glands.     Past Medical History:  Diagnosis Date  . Anemia    Family History  Problem Relation Age of Onset  . CAD Other   . Cancer Other   . Diabetes Mellitus II Neg Hx   . Anemia Neg Hx    Social History   Social History  . Marital status: Married    Spouse name: N/A  . Number of children: N/A  . Years of education: N/A   Occupational History  . Not on file.   Social History Main Topics  . Smoking status: Light Tobacco Smoker    Types: Cigarettes  . Smokeless tobacco: Not on file  . Alcohol use Yes     Comment: occasional  . Drug use: No  . Sexual activity: Yes    Birth control/ protection: Surgical   Other Topics Concern  . Not on file   Social History Narrative  . No narrative on file   Current Meds  Medication Sig  . [DISCONTINUED] Pseudoeph-Doxylamine-DM-APAP (NYQUIL PO) Take by mouth.   No Known Allergies    ROS: As per HPI, remainder of ROS negative.   OBJECTIVE:   Vitals:   05/21/17 1125  BP: 123/88  Pulse: 93  Resp: 20  Temp: (!) 100.4 F (38 C)  TempSrc: Oral  SpO2: 100%     General appearance: alert; no distress Eyes: PERRL; EOMI; conjunctiva normal HENT: normocephalic; atraumatic; TMs normal, canal normal, external ears normal without trauma; nasal mucosa normal; coated white left tonsil that is swollen Neck: supple Lungs: coughing, bibasilar rales. Heart: regular rate and rhythm Back: no CVA tenderness Extremities: no cyanosis or edema; symmetrical with no gross deformities Skin: warm and dry Neurologic: normal gait; grossly normal Psychological: alert and cooperative; normal mood and affect      Labs:  Results for orders placed or performed in visit  on 05/03/16  Cytology - PAP  Result Value Ref Range   CYTOLOGY - PAP      Labs Reviewed - No data to display  No results found.     ASSESSMENT & PLAN:  1. Acute tonsillitis, unspecified etiology     Meds ordered this encounter  Medications  . DISCONTD: Pseudoeph-Doxylamine-DM-APAP (NYQUIL PO)    Sig: Take by mouth.  Marland Kitchen HYDROcodone-homatropine (HYCODAN) 5-1.5 MG/5ML syrup    Sig: Take 5 mLs by mouth every 6 (six) hours as needed for cough. 5-10 ml every 4-6 hours as needed for cough    Dispense:  60 mL    Refill:  0  . amoxicillin (AMOXIL) 875 MG tablet    Sig: Take 1 tablet (875 mg total) by mouth 2 (two) times daily.    Dispense:  20 tablet    Refill:  0    Reviewed expectations re: course of current medical issues. Questions answered. Outlined signs and symptoms indicating need for more acute intervention. Patient verbalized understanding. After Visit Summary given.    Procedures:      Elvina Sidle, MD 05/21/17 1127

## 2018-06-08 ENCOUNTER — Encounter (HOSPITAL_BASED_OUTPATIENT_CLINIC_OR_DEPARTMENT_OTHER): Payer: Self-pay

## 2018-06-08 ENCOUNTER — Other Ambulatory Visit: Payer: Self-pay

## 2018-06-08 ENCOUNTER — Emergency Department (HOSPITAL_BASED_OUTPATIENT_CLINIC_OR_DEPARTMENT_OTHER)
Admission: EM | Admit: 2018-06-08 | Discharge: 2018-06-08 | Disposition: A | Discharge status: Home / Self Care | Payer: Medicaid Other | Attending: Emergency Medicine | Admitting: Emergency Medicine

## 2018-06-08 DIAGNOSIS — K047 Periapical abscess without sinus: Secondary | ICD-10-CM | POA: Insufficient documentation

## 2018-06-08 DIAGNOSIS — F17210 Nicotine dependence, cigarettes, uncomplicated: Secondary | ICD-10-CM | POA: Insufficient documentation

## 2018-06-08 MED ORDER — IBUPROFEN 600 MG PO TABS: 600.0000 mg | ORAL_TABLET | Freq: Three times a day (TID) | ORAL | 0 refills | Status: AC | PRN

## 2018-06-08 MED ORDER — IBUPROFEN 400 MG PO TABS
600.0000 mg | ORAL_TABLET | Freq: Once | ORAL | Status: AC
  Administered 2018-06-08: 600 mg via ORAL
  Filled 2018-06-08: qty 1

## 2018-06-08 MED ORDER — ACETAMINOPHEN 325 MG PO TABS
650.0000 mg | ORAL_TABLET | Freq: Once | ORAL | Status: AC
  Administered 2018-06-08: 650 mg via ORAL
  Filled 2018-06-08: qty 2

## 2018-06-08 MED ORDER — PENICILLIN V POTASSIUM 250 MG PO TABS
500.0000 mg | ORAL_TABLET | Freq: Once | ORAL | Status: AC
  Administered 2018-06-08: 500 mg via ORAL
  Filled 2018-06-08: qty 2

## 2018-06-08 MED ORDER — PENICILLIN V POTASSIUM 500 MG PO TABS: 500.0000 mg | ORAL_TABLET | Freq: Three times a day (TID) | ORAL | 0 refills | Status: AC

## 2018-06-08 MED FILL — PENICILLIN VK 500 MG TABLET: 500 | 10 days supply | Qty: 30 | Fill #0

## 2018-06-08 NOTE — ED Provider Notes (Signed)
[facility] EMERGENCY DEPARTMENT Provider Note   CSN: [id number] Arrival date & time: 06/08/18  4098     History   Chief Complaint Chief Complaint  Patient presents with  . Dental Pain    Meghan Bennett is a 32 y.o. female.  Meghan Patient is a 32 year old female presents the emergency department with worsening right facial swelling through the night and right lower dental pain which began yesterday.  She is taken ibuprofen for the pain.  She came to the ER today because of the new swelling.  No difficulty breathing or swallowing.  She does not have a dentist.  Her symptoms are moderate in severity.  No fevers.  No change in her voice.   Past Medical History:  Diagnosis Date  . Anemia     Patient Active Problem List   Diagnosis Date Noted  . Unintended weight loss 04/03/2014  . Other malaise and fatigue 04/03/2014  . Erosive gastritis 04/03/2014  . Anemia, iron deficiency 04/03/2014  . Thyroid nodule 04/03/2014  . Hyperglycemia 04/03/2014  . Menorrhagia 02/08/2014  . Anemia 02/07/2014    Past Surgical History:  Procedure Laterality Date  . ESOPHAGOGASTRODUODENOSCOPY Left 02/09/2014   Procedure: ESOPHAGOGASTRODUODENOSCOPY (EGD);  Surgeon: Theda Belfast, MD;  Location: [facility] ENDOSCOPY;  Service: Endoscopy;  Laterality: Left;  . TUBAL LIGATION  2011     OB History   None      Home Medications    Prior to Admission medications   Medication Sig Start Date End Date Taking? Authorizing Provider  ibuprofen (ADVIL,MOTRIN) 600 MG tablet Take 1 tablet (600 mg total) by mouth every 8 (eight) hours as needed. 06/08/18   Azalia Bilis, MD  penicillin v potassium (VEETID) 500 MG tablet Take 1 tablet (500 mg total) by mouth 3 (three) times daily. 06/08/18   Azalia Bilis, MD    Family History Family History  Problem Relation Age of Onset  . CAD Other   . Cancer Other   . Diabetes Mellitus II Neg Hx   . Anemia Neg Hx     Social History Social History    Tobacco Use  . Smoking status: Light Tobacco Smoker    Types: Cigarettes  . Smokeless tobacco: Former Engineer, water Use Topics  . Alcohol use: Yes    Comment: occasional  . Drug use: No     Allergies   Patient has no known allergies.   Review of Systems Review of Systems  All other systems reviewed and are negative.    Physical Exam Updated Vital Signs BP (!) 143/85 (BP Location: Right Arm)   Pulse 83   Temp 98.6 F (37 C) (Oral)   Resp 18   Ht 5\' 2"  (1.575 m)   Wt 59 kg   LMP 05/22/2018 (Within Days)   SpO2 100%   BMI 23.78 kg/m   Physical Exam  Constitutional: She is oriented to person, place, and time. She appears well-developed and well-nourished.  HENT:  Head: Normocephalic.  Right-sided facial swelling.  Decay and tenderness of her right lower first molar without gingival swelling or fluctuance.  Space under her tongue is soft.  Anterior neck is normal.  Tolerating secretions.  Oral airway patent.  No trismus.  Eyes: EOM are normal.  Neck: Normal range of motion.  Pulmonary/Chest: Effort normal.  Abdominal: She exhibits no distension.  Musculoskeletal: Normal range of motion.  Neurological: She is alert and oriented to person, place, and time.  Psychiatric: She has a normal  mood and affect.  Nursing note and vitals reviewed.    ED Treatments / Results  Labs (all labs ordered are listed, but only abnormal results are displayed) Labs Reviewed - No data to display  EKG None  Radiology No results found.  Procedures Procedures (including critical care time)  Medications Ordered in ED Medications  penicillin v potassium (VEETID) tablet 500 mg (has no administration in time range)  ibuprofen (ADVIL,MOTRIN) tablet 600 mg (has no administration in time range)  acetaminophen (TYLENOL) tablet 650 mg (has no administration in time range)     Initial Impression / Assessment and Plan / ED Course  I have reviewed the triage vital signs and the  nursing notes.  Pertinent labs & imaging results that were available during my care of the patient were reviewed by me and considered in my medical decision making (see chart for details).     Dental infection with associated mild right-sided facial swelling.  No gingival abscess to drain at this time.  Home with anti-inflammatories and over-the-counter pain medication as well as antibiotics x10 days.  She will need a dentist for follow-up.  She is encouraged to return to the emergency department for new or worsening symptoms  Final Clinical Impressions(s) / ED Diagnoses   Final diagnoses:  Dental infection    ED Discharge Orders         Ordered    penicillin v potassium (VEETID) 500 MG tablet  3 times daily     06/08/18 0959    ibuprofen (ADVIL,MOTRIN) 600 MG tablet  Every 8 hours PRN     06/08/18 0959           Azalia Bilis, MD 06/08/18 1002

## 2018-06-08 NOTE — ED Triage Notes (Signed)
Pt arrives with right facial swelling and dental pain that started yesterday. Took ibuprofen yesterday for pain.

## 2018-06-08 NOTE — Discharge Instructions (Signed)
Please call a dentist for follow up °

## 2019-01-31 ENCOUNTER — Emergency Department (HOSPITAL_COMMUNITY)
Admission: EM | Admit: 2019-01-31 | Discharge: 2019-01-31 | Disposition: A | Discharge status: Home / Self Care | Payer: HRSA Program | Attending: Emergency Medicine | Admitting: Emergency Medicine

## 2019-01-31 ENCOUNTER — Encounter (HOSPITAL_COMMUNITY): Payer: Self-pay | Admitting: Emergency Medicine

## 2019-01-31 DIAGNOSIS — Z20822 Contact with and (suspected) exposure to covid-19: Secondary | ICD-10-CM

## 2019-01-31 DIAGNOSIS — F17210 Nicotine dependence, cigarettes, uncomplicated: Secondary | ICD-10-CM | POA: Diagnosis not present

## 2019-01-31 DIAGNOSIS — U071 COVID-19: Secondary | ICD-10-CM | POA: Insufficient documentation

## 2019-01-31 DIAGNOSIS — R51 Headache: Secondary | ICD-10-CM | POA: Diagnosis present

## 2019-01-31 DIAGNOSIS — Z20828 Contact with and (suspected) exposure to other viral communicable diseases: Secondary | ICD-10-CM

## 2019-01-31 NOTE — ED Provider Notes (Signed)
[place] EMERGENCY DEPARTMENT Provider Note   CSN: [id number] Arrival date & time: 01/31/19  7829    History   Chief Complaint Chief Complaint  Patient presents with  . covid exposure    HPI Meghan Bennett is a 33 y.o. female.     HPI   33 year old female presents today with probable exposure to COVID-19.  She states her boyfriend tested positive yesterday as a bus driver.  They spend a lot of time together but do not live in the same household.  She reports having some headache and congestion today.  She is not coughing or dyspneic.  She has no risk factors for severe infection.  She states her last menstrual period was last month and normal she is status post bilateral tubal ligation. Past Medical History:  Diagnosis Date  . Anemia     Patient Active Problem List   Diagnosis Date Noted  . Unintended weight loss 04/03/2014  . Other malaise and fatigue 04/03/2014  . Erosive gastritis 04/03/2014  . Anemia, iron deficiency 04/03/2014  . Thyroid nodule 04/03/2014  . Hyperglycemia 04/03/2014  . Menorrhagia 02/08/2014  . Anemia 02/07/2014    Past Surgical History:  Procedure Laterality Date  . ESOPHAGOGASTRODUODENOSCOPY Left 02/09/2014   Procedure: ESOPHAGOGASTRODUODENOSCOPY (EGD);  Surgeon: Beryle Beams, MD;  Location: [facility] ENDOSCOPY;  Service: Endoscopy;  Laterality: Left;  . TUBAL LIGATION  2011     OB History   No obstetric history on file.      Home Medications    Prior to Admission medications   Medication Sig Start Date End Date Taking? Authorizing Provider  ibuprofen (ADVIL,MOTRIN) 600 MG tablet Take 1 tablet (600 mg total) by mouth every 8 (eight) hours as needed. 06/08/18   Jola Schmidt, MD  penicillin v potassium (VEETID) 500 MG tablet Take 1 tablet (500 mg total) by mouth 3 (three) times daily. 06/08/18   Jola Schmidt, MD    Family History Family History  Problem Relation Age of Onset  . CAD Other   . Cancer Other   .  Diabetes Mellitus II Neg Hx   . Anemia Neg Hx     Social History Social History   Tobacco Use  . Smoking status: Light Tobacco Smoker    Types: Cigarettes  . Smokeless tobacco: Former Network engineer Use Topics  . Alcohol use: Yes    Comment: occasional  . Drug use: No     Allergies   Patient has no known allergies.   Review of Systems Review of Systems  All other systems reviewed and are negative.    Physical Exam Updated Vital Signs BP 120/85 (BP Location: Right Arm)   Pulse (!) 104   Temp 99.1 F (37.3 C) (Oral)   Resp 19   SpO2 100%   Physical Exam Vitals signs and nursing note reviewed.  Constitutional:      General: She is not in acute distress.    Appearance: Normal appearance. She is normal weight.  HENT:     Head: Normocephalic.     Right Ear: Tympanic membrane and external ear normal.     Left Ear: External ear normal.     Nose: Nose normal.     Mouth/Throat:     Mouth: Mucous membranes are moist.     Pharynx: Oropharynx is clear.  Eyes:     Pupils: Pupils are equal, round, and reactive to light.  Neck:     Musculoskeletal: Normal range of motion.  Cardiovascular:     Rate and Rhythm: Normal rate and regular rhythm.  Pulmonary:     Effort: Pulmonary effort is normal.     Breath sounds: Normal breath sounds.  Abdominal:     General: Abdomen is flat.     Palpations: Abdomen is soft.  Musculoskeletal: Normal range of motion.  Skin:    General: Skin is warm and dry.     Capillary Refill: Capillary refill takes less than 2 seconds.     Findings: No rash.  Neurological:     General: No focal deficit present.     Mental Status: She is alert.  Psychiatric:        Mood and Affect: Mood normal.        Behavior: Behavior normal.      ED Treatments / Results  Labs (all labs ordered are listed, but only abnormal results are displayed) Labs Reviewed  NOVEL CORONAVIRUS, NAA (HOSPITAL ORDER, SEND-OUT TO REF LAB)    EKG None  Radiology  No results found.  Procedures Procedures (including critical care time)  Medications Ordered in ED Medications - No data to display   Initial Impression / Assessment and Plan / ED Course  I have reviewed the triage vital signs and the nursing notes.  Pertinent labs & imaging results that were available during my care of the patient were reviewed by me and considered in my medical decision making (see chart for details).       Plan COVID testing Patient informed that she will have to be quarantined.  She will be given instructions regarding quarantining, return precautions, especially dyspnea, and need for follow-up. She voices understanding of above Final Clinical Impressions(s) / ED Diagnoses   Final diagnoses:  Exposure to Covid-19 Virus    ED Discharge Orders    None       Margarita Grizzleay, [place], MD 01/31/19 [phone]

## 2019-01-31 NOTE — Discharge Instructions (Addendum)
You have been exposed to Covid 19 You currently have very mild symptoms which may represent infection You have had a covid test performed and results should be available in 1-2 days You must remain quarantined regardless of test results, please follow cdc instructions

## 2019-01-31 NOTE — ED Triage Notes (Signed)
Pt states her boyfriend was diagnosed with covid this weekend and she had been around him up until he was diagnosed. Yesterday patient began to have a headache with sore throat and sob. denies any cp.

## 2019-02-02 LAB — NOVEL CORONAVIRUS, NAA (HOSP ORDER, SEND-OUT TO REF LAB; TAT 18-24 HRS): SARS-CoV-2, NAA: DETECTED — AB

## 2019-02-27 ENCOUNTER — Emergency Department (HOSPITAL_COMMUNITY)
Admission: EM | Admit: 2019-02-27 | Discharge: 2019-02-27 | Disposition: A | Discharge status: Home / Self Care | Payer: Medicaid Other | Attending: Emergency Medicine | Admitting: Emergency Medicine

## 2019-02-27 DIAGNOSIS — R197 Diarrhea, unspecified: Secondary | ICD-10-CM | POA: Insufficient documentation

## 2019-02-27 DIAGNOSIS — R112 Nausea with vomiting, unspecified: Secondary | ICD-10-CM | POA: Insufficient documentation

## 2019-02-27 DIAGNOSIS — F17210 Nicotine dependence, cigarettes, uncomplicated: Secondary | ICD-10-CM | POA: Insufficient documentation

## 2019-02-27 DIAGNOSIS — R1084 Generalized abdominal pain: Secondary | ICD-10-CM

## 2019-02-27 LAB — I-STAT BETA HCG BLOOD, ED (MC, WL, AP ONLY): I-stat hCG, quantitative: <5 m[IU]/mL (ref <5)

## 2019-02-27 LAB — COMPREHENSIVE METABOLIC PANEL
ALT: 15 U/L (ref 0–44)
AST: 21 U/L (ref 15–41)
Albumin: 4.1 g/dL (ref 3.5–5.0)
Alkaline Phosphatase: 59 U/L (ref 38–126)
Anion gap: 10 (ref 5–15)
BUN: 6 mg/dL (ref 6–20)
CO2: 23 mmol/L (ref 22–32)
Calcium: 8.9 mg/dL (ref 8.9–10.3)
Chloride: 107 mmol/L (ref 98–111)
Creatinine, Ser: 0.71 mg/dL (ref 0.44–1.00)
GFR calc Af Amer: >60 mL/min (ref >60)
GFR calc non Af Amer: >60 mL/min (ref >60)
Glucose, Bld: 131 mg/dL — ABNORMAL HIGH (ref 70–99)
Potassium: 4 mmol/L (ref 3.5–5.1)
Sodium: 140 mmol/L (ref 135–145)
Total Bilirubin: 0.4 mg/dL (ref 0.3–1.2)
Total Protein: 7.2 g/dL (ref 6.5–8.1)

## 2019-02-27 LAB — URINALYSIS, ROUTINE W REFLEX MICROSCOPIC
Bacteria, UA: NONE SEEN
Bilirubin Urine: NEGATIVE
Glucose, UA: NEGATIVE mg/dL
Ketones, ur: NEGATIVE mg/dL
Leukocytes,Ua: NEGATIVE
Nitrite: NEGATIVE
Protein, ur: 30 mg/dL — AB
Specific Gravity, Urine: 1.02 (ref 1.005–1.030)
pH: 7 (ref 5.0–8.0)

## 2019-02-27 LAB — CBC WITH DIFFERENTIAL/PLATELET
Abs Immature Granulocytes: 0.01 10*3/uL (ref 0.00–0.07)
Basophils Absolute: 0 10*3/uL (ref 0.0–0.1)
Basophils Relative: 1 %
Eosinophils Absolute: 0 10*3/uL (ref 0.0–0.5)
Eosinophils Relative: 0 %
HCT: 34.2 % — ABNORMAL LOW (ref 36.0–46.0)
Hemoglobin: 10.6 g/dL — ABNORMAL LOW (ref 12.0–15.0)
Immature Granulocytes: 0 %
Lymphocytes Relative: 16 %
Lymphs Abs: 1 10*3/uL (ref 0.7–4.0)
MCH: 26.3 pg (ref 26.0–34.0)
MCHC: 31 g/dL (ref 30.0–36.0)
MCV: 84.9 fL (ref 80.0–100.0)
Monocytes Absolute: 0.4 10*3/uL (ref 0.1–1.0)
Monocytes Relative: 6 %
Neutro Abs: 4.9 10*3/uL (ref 1.7–7.7)
Neutrophils Relative %: 77 %
Platelets: 208 10*3/uL (ref 150–400)
RBC: 4.03 MIL/uL (ref 3.87–5.11)
RDW: 16.3 % — ABNORMAL HIGH (ref 11.5–15.5)
WBC: 6.4 10*3/uL (ref 4.0–10.5)
nRBC: 0 % (ref 0.0–0.2)

## 2019-02-27 LAB — LIPASE, BLOOD: Lipase: 36 U/L (ref 11–51)

## 2019-02-27 MED ORDER — LIDOCAINE VISCOUS HCL 2 % MT SOLN
15.0000 mL | Freq: Once | OROMUCOSAL | Status: AC
  Administered 2019-02-27: 10:00:00 15 mL via ORAL
  Filled 2019-02-27: qty 15

## 2019-02-27 MED ORDER — FAMOTIDINE 20 MG PO TABS: 20.0000 mg | ORAL_TABLET | Freq: Two times a day (BID) | ORAL | 0 refills | Status: AC

## 2019-02-27 MED ORDER — ALUM & MAG HYDROXIDE-SIMETH 200-200-20 MG/5ML PO SUSP
30.0000 mL | Freq: Once | ORAL | Status: AC
  Administered 2019-02-27: 10:00:00 30 mL via ORAL
  Filled 2019-02-27: qty 30

## 2019-02-27 MED ORDER — PROMETHAZINE HCL 25 MG/ML IJ SOLN
12.5000 mg | Freq: Once | INTRAMUSCULAR | Status: AC
  Administered 2019-02-27: 09:00:00 12.5 mg via INTRAVENOUS
  Filled 2019-02-27: qty 1

## 2019-02-27 MED ORDER — ONDANSETRON 4 MG PO TBDP: 4.0000 mg | ORAL_TABLET | Freq: Three times a day (TID) | ORAL | 0 refills | Status: AC | PRN

## 2019-02-27 MED ORDER — ONDANSETRON 4 MG PO TBDP
4.0000 mg | ORAL_TABLET | Freq: Once | ORAL | Status: AC
  Administered 2019-02-27: 08:00:00 4 mg via ORAL
  Filled 2019-02-27: qty 1

## 2019-02-27 MED ORDER — SUCRALFATE 1 G PO TABS: 1.0000 g | ORAL_TABLET | Freq: Three times a day (TID) | ORAL | 0 refills | Status: AC

## 2019-02-27 MED ORDER — SODIUM CHLORIDE 0.9 % IV BOLUS
1000.0000 mL | Freq: Once | INTRAVENOUS | Status: AC
  Administered 2019-02-27: 1000 mL via INTRAVENOUS

## 2019-02-27 NOTE — ED Triage Notes (Signed)
Pt presents to the ED with abdominal pain that started around 0300 this morning. Pt reports she thinks that she has alcohol poisoning. Pt reports that she has 10/10 abdominal pain diffuse across her abdomen with multiple episodes of vomiting and two episodes of diarrhea. Pt is unsure of how much she drank last night.

## 2019-02-27 NOTE — ED Triage Notes (Signed)
BoyfriendMallie [phone]

## 2019-02-27 NOTE — ED Provider Notes (Signed)
[facility] Provider Note   CSN: [id number] Arrival date & time: 02/27/19  [id number]    History   Chief Complaint Chief Complaint  Patient presents with  . Abdominal Pain    HPI Meghan Bennett is a 33 y.o. female with history of anemia, erosive gastritis presents for evaluation of acute onset, progressively worsening generalized abdominal pain with nausea vomiting and diarrhea.  Symptoms began around 3 AM this morning.  She states she thinks she has alcohol poisoning as she had several liquor beverages last night but she is unsure how much she had to drink.  Pain is generalized.  She has a difficult time describing it due to the discomfort she is experiencing from her nausea.  She is had several episodes of nonbloody nonbilious emesis and a few episodes of watery nonbloody diarrhea.  Denies fevers, chest pain, shortness of breath, urinary symptoms, vaginal itching, bleeding, or discharge. Also of note, the patient tested positive for COVID-19 on 01/31/2019.     The history is provided by the patient.    Past Medical History:  Diagnosis Date  . Anemia     Patient Active Problem List   Diagnosis Date Noted  . Unintended weight loss 04/03/2014  . Other malaise and fatigue 04/03/2014  . Erosive gastritis 04/03/2014  . Anemia, iron deficiency 04/03/2014  . Thyroid nodule 04/03/2014  . Hyperglycemia 04/03/2014  . Menorrhagia 02/08/2014  . Anemia 02/07/2014    Past Surgical History:  Procedure Laterality Date  . ESOPHAGOGASTRODUODENOSCOPY Left 02/09/2014   Procedure: ESOPHAGOGASTRODUODENOSCOPY (EGD);  Surgeon: Theda BelfastPatrick D Hung, MD;  [facility];  Service: Endoscopy;  Laterality: Left;  . TUBAL LIGATION  2011     OB History   No obstetric history on file.      Home Medications    Prior to Admission medications   Medication Sig Start Date End Date Taking? Authorizing Provider  famotidine (PEPCID) 20 MG tablet Take 1 tablet (20 mg  total) by mouth 2 (two) times daily. 02/27/19   Vallie Teters A, PA-C  ibuprofen (ADVIL,MOTRIN) 600 MG tablet Take 1 tablet (600 mg total) by mouth every 8 (eight) hours as needed. 06/08/18   Azalia Bilisampos, Kevin, MD  ondansetron (ZOFRAN ODT) 4 MG disintegrating tablet Take 1 tablet (4 mg total) by mouth every 8 (eight) hours as needed for nausea or vomiting. 02/27/19   Gracin Soohoo A, PA-C  penicillin v potassium (VEETID) 500 MG tablet Take 1 tablet (500 mg total) by mouth 3 (three) times daily. 06/08/18   Azalia Bilisampos, Kevin, MD  sucralfate (CARAFATE) 1 g tablet Take 1 tablet (1 g total) by mouth 4 (four) times daily -  with meals and at bedtime. 02/27/19   Jeanie SewerFawze, Karrisa Didio A, PA-C    Family History Family History  Problem Relation Age of Onset  . CAD Other   . Cancer Other   . Diabetes Mellitus II Neg Hx   . Anemia Neg Hx     Social History Social History   Tobacco Use  . Smoking status: Light Tobacco Smoker    Types: Cigarettes  . Smokeless tobacco: Former Engineer, waterUser  Substance Use Topics  . Alcohol use: Yes    Comment: occasional  . Drug use: No     Allergies   Patient has no known allergies.   Review of Systems Review of Systems  Constitutional: Negative for chills and fever.  Respiratory: Negative for shortness of breath.   Cardiovascular: Negative for chest pain.  Gastrointestinal:  Positive for abdominal pain, diarrhea, nausea and vomiting.  All other systems reviewed and are negative.    Physical Exam Updated Vital Signs BP 120/86   Pulse 76   Temp 98.1 F (36.7 C) (Oral)   Resp 17   SpO2 100%   Physical Exam Vitals signs and nursing note reviewed.  Constitutional:      General: She is not in acute distress.    Appearance: She is well-developed.     Comments: Appears uncomfortable, laying in bed in fetal position, actively dry heaving  HENT:     Head: Normocephalic and atraumatic.  Eyes:     General:        Right eye: No discharge.        Left eye: No discharge.      Conjunctiva/sclera: Conjunctivae normal.  Neck:     Vascular: No JVD.     Trachea: No tracheal deviation.  Cardiovascular:     Rate and Rhythm: Normal rate and regular rhythm.  Pulmonary:     Effort: Pulmonary effort is normal.     Breath sounds: Normal breath sounds.  Abdominal:     General: Abdomen is flat. Bowel sounds are normal. There is no distension.     Palpations: Abdomen is soft.     Tenderness: There is generalized abdominal tenderness. There is no right CVA tenderness, left CVA tenderness, guarding or rebound.  Skin:    General: Skin is warm and dry.     Findings: No erythema.  Neurological:     Mental Status: She is alert.  Psychiatric:        Behavior: Behavior normal.      ED Treatments / Results  Labs (all labs ordered are listed, but only abnormal results are displayed) Labs Reviewed  CBC WITH DIFFERENTIAL/PLATELET - Abnormal; Notable for the following components:      Result Value   Hemoglobin 10.6 (*)    HCT 34.2 (*)    RDW 16.3 (*)    All other components within normal limits  COMPREHENSIVE METABOLIC PANEL - Abnormal; Notable for the following components:   Glucose, Bld 131 (*)    All other components within normal limits  URINALYSIS, ROUTINE W REFLEX MICROSCOPIC - Abnormal; Notable for the following components:   Hgb urine dipstick SMALL (*)    Protein, ur 30 (*)    All other components within normal limits  LIPASE, BLOOD  I-STAT BETA HCG BLOOD, ED ([facility], [facility], [facility] ONLY)    EKG None  Radiology No results found.  Procedures Procedures (including critical care time)  Medications Ordered in ED Medications  ondansetron (ZOFRAN-ODT) disintegrating tablet 4 mg (4 mg Oral Given 02/27/19 0813)  sodium chloride 0.9 % bolus 1,000 mL (1,000 mLs Intravenous New Bag/Given 02/27/19 0852)  promethazine (PHENERGAN) injection 12.5 mg (12.5 mg Intravenous Given 02/27/19 0901)  alum & mag hydroxide-simeth (MAALOX/MYLANTA) 200-200-20 MG/5ML suspension 30 mL (30 mLs  Oral Given 02/27/19 0955)    And  lidocaine (XYLOCAINE) 2 % viscous mouth solution 15 mL (15 mLs Oral Given 02/27/19 0955)     Initial Impression / Assessment and Plan / ED Course  I have reviewed the triage vital signs and the nursing notes.  Pertinent labs & imaging results that were available during my care of the patient were reviewed by me and considered in my medical decision making (see chart for details).        Patient presenting for evaluation of generalized abdominal pain with nausea and vomiting after drinking  an unknown amount of alcohol last night.  She is afebrile, vital signs are stable.  She is nontoxic in appearance though actively vomiting on my initial assessment.  She exhibits generalized abdominal tenderness with no peritoneal signs, no focal tenderness.  Blood work reviewed by me shows no leukocytosis, mild anemia, no metabolic derangements, no renal insufficiency.  LFTs and lipase are within normal limits.  Her UA shows no evidence of UTI or nephrolithiasis.  She received Zofran ODT prior to establishment of an IV but had persistent vomiting so she received IV Phenergan, IV fluids, and a GI cocktail.  On reevaluation she is resting comfortably, reports that she is feeling better.  She is tolerating sips of fluid.  Serial abdominal examinations remain benign.  Given reassuring examination and blood work, I do not feel that she requires emergent CT scanning presently.  Differential includes gastritis versus gastroenteritis.  Doubt acute surgical abdominal pathology.  No GU complaints to suggest TOA, PID, ovarian torsion, or ectopic pregnancy.  Stable for discharge home with symptomatic management. Recommend bland diet, advancing diet slowly. Discussed strict ED return precautions. Patient verbalized understanding of and agreement with plan and is safe for discharge home at this time.    Final Clinical Impressions(s) / ED Diagnoses   Final diagnoses:  Generalized abdominal pain   Non-intractable vomiting with nausea, unspecified vomiting type    ED Discharge Orders         Ordered    ondansetron (ZOFRAN ODT) 4 MG disintegrating tablet  Every 8 hours PRN     02/27/19 1153    famotidine (PEPCID) 20 MG tablet  2 times daily     02/27/19 1153    sucralfate (CARAFATE) 1 g tablet  3 times daily with meals & bedtime     02/27/19 1153           Hendrix Yurkovich, [place] A, PA-C 02/27/19 [place], [place], DO 02/27/19 1523

## 2019-02-27 NOTE — ED Notes (Signed)
Patient verbalizes understanding of discharge instructions. Opportunity for questioning and answers were provided. Armband removed by staff, pt discharged from ED.  

## 2019-02-27 NOTE — Discharge Instructions (Signed)
1. Medications: Take Zofran as needed for nausea.  Let this medicine dissolve under your tongue and wait around 10-20 minutes before eating or drinking after taking this medication. Can also take pepcid twice daily and carafate with meals to help soothe your stomach.  2. Treatment: rest, drink plenty of fluids, advance diet slowly.  Start with water and broth then advance to bland foods that will not upset your stomach such as crackers, mashed potatoes, and peanut butter. 3. Follow Up: Please followup with your primary doctor in 3 days for discussion of your diagnoses and further evaluation after today's visit; if you do not have a primary care doctor use the resource guide provided to find one; Please return to the ER for persistent vomiting, high fevers or worsening symptoms

## 2024-04-22 ENCOUNTER — Emergency Department (HOSPITAL_COMMUNITY): Payer: Self-pay

## 2024-04-22 ENCOUNTER — Emergency Department (HOSPITAL_COMMUNITY)
Admission: EM | Admit: 2024-04-22 | Discharge: 2024-04-22 | Disposition: A | Discharge status: Home / Self Care | Payer: Self-pay | Attending: Emergency Medicine | Admitting: Emergency Medicine

## 2024-04-22 ENCOUNTER — Other Ambulatory Visit: Payer: Self-pay

## 2024-04-22 ENCOUNTER — Encounter (HOSPITAL_COMMUNITY): Payer: Self-pay

## 2024-04-22 DIAGNOSIS — M542 Cervicalgia: Secondary | ICD-10-CM | POA: Diagnosis not present

## 2024-04-22 DIAGNOSIS — Y92410 Unspecified street and highway as the place of occurrence of the external cause: Secondary | ICD-10-CM | POA: Diagnosis not present

## 2024-04-22 DIAGNOSIS — M545 Low back pain, unspecified: Secondary | ICD-10-CM | POA: Diagnosis not present

## 2024-04-22 DIAGNOSIS — Z91040 Latex allergy status: Secondary | ICD-10-CM | POA: Diagnosis not present

## 2024-04-22 DIAGNOSIS — S46811A Strain of other muscles, fascia and tendons at shoulder and upper arm level, right arm, initial encounter: Secondary | ICD-10-CM | POA: Insufficient documentation

## 2024-04-22 DIAGNOSIS — S4991XA Unspecified injury of right shoulder and upper arm, initial encounter: Secondary | ICD-10-CM | POA: Diagnosis present

## 2024-04-22 LAB — POC URINE PREG, ED: Preg Test, Ur: NEGATIVE

## 2024-04-22 NOTE — Discharge Instructions (Signed)
 This should get better over the course of the week.  Please follow-up with your family doctor in the office.  Take 4 over the counter ibuprofen  tablets 3 times a day or 2 over-the-counter naproxen tablets twice a day for pain. Also take tylenol  1000mg (2 extra strength) four times a day.
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
